# Patient Record
Sex: Male | Born: 1955 | Race: Black or African American | Hispanic: No | Marital: Married | Smoking: Never smoker
Health system: Southern US, Community
[De-identification: ages and names within clinical notes are randomized; demographics above are authoritative.]

## PROBLEM LIST (undated history)

## (undated) DIAGNOSIS — E119 Type 2 diabetes mellitus without complications: Secondary | ICD-10-CM

## (undated) DIAGNOSIS — H547 Unspecified visual loss: Secondary | ICD-10-CM

## (undated) DIAGNOSIS — R000 Tachycardia, unspecified: Secondary | ICD-10-CM

## (undated) DIAGNOSIS — E785 Hyperlipidemia, unspecified: Secondary | ICD-10-CM

## (undated) DIAGNOSIS — I10 Essential (primary) hypertension: Secondary | ICD-10-CM

## (undated) DIAGNOSIS — G4733 Obstructive sleep apnea (adult) (pediatric): Secondary | ICD-10-CM

## (undated) DIAGNOSIS — K219 Gastro-esophageal reflux disease without esophagitis: Secondary | ICD-10-CM

## (undated) HISTORY — DX: Type 2 diabetes mellitus without complications: E11.9

## (undated) HISTORY — DX: Tachycardia, unspecified: R00.0

## (undated) HISTORY — DX: Unspecified visual loss: H54.7

## (undated) HISTORY — PX: CHOLECYSTECTOMY: SHX55

## (undated) HISTORY — DX: Essential (primary) hypertension: I10

## (undated) HISTORY — DX: Hyperlipidemia, unspecified: E78.5

## (undated) HISTORY — DX: Obstructive sleep apnea (adult) (pediatric): G47.33

## (undated) HISTORY — DX: Gastro-esophageal reflux disease without esophagitis: K21.9

---

## 2010-04-18 ENCOUNTER — Encounter: Admission: RE | Admit: 2010-04-18 | Discharge: 2010-04-26 | Payer: Self-pay | Admitting: Internal Medicine

## 2010-04-29 ENCOUNTER — Ambulatory Visit (HOSPITAL_BASED_OUTPATIENT_CLINIC_OR_DEPARTMENT_OTHER): Admission: RE | Admit: 2010-04-29 | Discharge: 2010-04-29 | Payer: Self-pay | Admitting: Internal Medicine

## 2010-05-07 ENCOUNTER — Ambulatory Visit: Payer: Self-pay | Admitting: Internal Medicine

## 2010-06-28 ENCOUNTER — Encounter: Admission: RE | Admit: 2010-06-28 | Discharge: 2010-09-15 | Payer: Self-pay | Admitting: Internal Medicine

## 2010-08-25 HISTORY — PX: TRANSTHORACIC ECHOCARDIOGRAM: SHX275

## 2011-04-03 ENCOUNTER — Encounter: Payer: Self-pay | Admitting: Internal Medicine

## 2011-04-03 DIAGNOSIS — M1990 Unspecified osteoarthritis, unspecified site: Secondary | ICD-10-CM

## 2011-04-07 ENCOUNTER — Encounter: Payer: Self-pay | Admitting: Internal Medicine

## 2012-06-10 ENCOUNTER — Encounter: Payer: Self-pay | Admitting: Internal Medicine

## 2012-06-10 ENCOUNTER — Ambulatory Visit (INDEPENDENT_AMBULATORY_CARE_PROVIDER_SITE_OTHER)
Admission: RE | Admit: 2012-06-10 | Discharge: 2012-06-10 | Disposition: A | Discharge status: Home / Self Care | Payer: Medicaid Other | Source: Ambulatory Visit | Attending: Internal Medicine | Admitting: Internal Medicine

## 2012-06-10 ENCOUNTER — Ambulatory Visit (INDEPENDENT_AMBULATORY_CARE_PROVIDER_SITE_OTHER): Payer: Medicaid Other | Admitting: Internal Medicine

## 2012-06-10 VITALS — BP 132/84 | HR 87 | Temp 98.0°F | Ht 63.0 in | Wt 223.6 lb

## 2012-06-10 DIAGNOSIS — R05 Cough: Secondary | ICD-10-CM | POA: Insufficient documentation

## 2012-06-10 DIAGNOSIS — R059 Cough, unspecified: Secondary | ICD-10-CM

## 2012-06-10 NOTE — Assessment & Plan Note (Signed)
The most common causes of chronic cough in immunocompetent adults include the following: upper airway cough syndrome (UACS), previously referred to as postnasal drip syndrome (PNDS), which is caused by variety of rhinosinus conditions; (2) asthma; (3) GERD; (4) chronic bronchitis from cigarette smoking or other inhaled environmental irritants; (5) nonasthmatic eosinophilic bronchitis; and (6) bronchiectasis.   These conditions, singly or in combination, have accounted for up to 94% of the causes of chronic cough in prospective studies.   Other conditions have constituted no >6% of the causes in prospective studies These have included bronchogenic carcinoma, chronic interstitial pneumonia, sarcoidosis, left ventricular failure, ACEI-induced cough, and aspiration from a condition associated with pharyngeal dysfunction.   Chronic cough is often simultaneously caused by more than one condition. A single cause has been found from 38 to 82% of the time, multiple causes from 18 to 62%. Multiply caused cough has been the result of three diseases up to 42% of the time.    Reviewed with pt: The standardized cough guidelines recently published in Chest by [place] in 2006  are a multiple step process (up to 12!) , not a single office visit,  and are intended  to address this problem logically,  with an alogrithm dependent on response to empiric treatment at  each progressive step  to determine a specific diagnosis with  minimal addtional testing needed. Therefore if compliance is an issue or can't be accurately verified then it's very unlikely the standard evaluation and treatment will be successful here.    Furthermore, response to therapy (other than acute cough suppression, which should only be used short term with avoidance of narcotic containing cough syrups if possible), can be a gradual process for which the patient may not receive immediate benefit.  Unlike going to an eye doctor where the right rx is  almost always the first one and is immediately effective, this is almost never the case in the management of chronic cough syndromes and the patient needs to commit up front to compliance with recommendations and have the patience to wait out a response for up to 6 weeks of therapy directed at the likely underlying problem(s).    1st step is max rx for gerd with diet/ ppi qam and h2 in pm then regroup with all meds in hand in 4 weeks using a trust but verify approach at each step along the way

## 2012-06-10 NOTE — Patient Instructions (Addendum)
Pepcid ac 20mg  one every night after supper and continue nexium 40 mg take 30 min before first meal of the day  GERD (REFLUX)  is an extremely common cause of respiratory symptoms like a cough, many times with no significant heartburn at all.    It can be treated with medication, but also with lifestyle changes including avoidance of late meals, excessive alcohol, smoking cessation, and avoid fatty foods, chocolate, peppermint, colas, red wine, and acidic juices such as orange juice.  NO MINT OR MENTHOL PRODUCTS SO NO COUGH DROPS  USE SUGARLESS CANDY INSTEAD (jolley ranchers or Stover's - buy at [employer] or [employer])  NO OIL BASED VITAMINS - use powdered substitutes.  Please schedule a follow up office visit in 4 weeks, sooner if needed with all medications in hand

## 2012-06-10 NOTE — Progress Notes (Signed)
Subjective:    Patient ID: Andrew [name], male    DOB: 11-09-1956  MRN: [id number]  HPI  58 yobm born prematurely and blind but no respiratory issues until cough while on acei resolved off the acei while in [place] before 2009 then moved to [place] and cough recurred around 2010 and therefore referred to pulmonary clinic  06/10/2012 by Dr Concepcion Elk.  06/10/2012 1st pulmonary ov/ Jeury Mcnab cc persistent indolent onset dry cough, indolent onset x 2-3 years, present daily, worse at hs with frequent awakening assoc with sensation of sore throat.  Taking nexium for year before breakfast. Cough had the following features s seasonal variation or sign assoc sob. Intensity and freq of cough really has not changed in months.  Cough some better with cough suppression, no change with zyrtec.    Kouffman Reflux v Neurogenic Cough Differentiator Reflux Neurogenic Comments  Do you awaken from a sound sleep coughing violently?                            With trouble breathing? Yes    Do you have choking episodes when you cannot  Get enough air, gasping for air ?              no    Do you usually cough when you lie down into  The bed, or when you just lie down to rest ?                          Yes    Do you usually cough after meals or eating?         no    Do you cough when (or after) you bend over?    no    Do you more-or-less cough all day long?  Yes   Does change of temperature make you cough?  no   Does laughing or chuckling cause you to cough?  no   Do fumes (perfume, automobile fumes, burned  Toast, etc.,) cause you to cough ?       no   Does speaking, singing, or talking on the phone cause you to cough   ?                no   Total        Review of Systems  Constitutional: Negative for fever and unexpected weight change.  HENT: Positive for sore throat. Negative for ear pain, nosebleeds, congestion, rhinorrhea, sneezing, trouble swallowing, dental problem, postnasal drip and sinus pressure.     Eyes: Negative for redness and itching.  Respiratory: Positive for cough and chest tightness. Negative for shortness of breath and wheezing.   Cardiovascular: Negative for palpitations and leg swelling.  Gastrointestinal: Negative for nausea and vomiting.  Genitourinary: Negative for dysuria.  Musculoskeletal: Negative for joint swelling.  Skin: Negative for rash.  Neurological: Negative for headaches.  Hematological: Does not bruise/bleed easily.  Psychiatric/Behavioral: Negative for dysphoric mood. The patient is not nervous/anxious.        Objective:   Physical Exam amb blind obese bm nad occ throat clearing during interview  Wt 223 06/10/2012   HEENT: nl dentition, turbinates, and orophanx. Nl external ear canals without cough reflex   NECK :  without JVD/Nodes/TM/ nl carotid upstrokes bilaterally   LUNGS: no acc muscle use, clear to A and P bilaterally without cough on insp or exp maneuvers   CV:  RRR  no s3 or murmur or increase in P2, no edema   ABD:  Obese soft and nontender with nl excursion in the supine position. No bruits or organomegaly, bowel sounds nl  MS:  warm without deformities, calf tenderness, cyanosis or clubbing  SKIN: warm and dry without lesions    NEURO:  alert, approp, no deficits  CXR  06/10/2012 :  low lung vol/ otherwise nl         Assessment & Plan:

## 2012-06-11 NOTE — Progress Notes (Signed)
Quick Note:  Spoke with pt and notified of results per Dr. Wert. Pt verbalized understanding and denied any questions.  ______ 

## 2012-07-12 ENCOUNTER — Ambulatory Visit (INDEPENDENT_AMBULATORY_CARE_PROVIDER_SITE_OTHER): Payer: Medicaid Other | Admitting: Internal Medicine

## 2012-07-12 ENCOUNTER — Encounter: Payer: Self-pay | Admitting: Internal Medicine

## 2012-07-12 VITALS — BP 128/84 | HR 88 | Temp 97.8°F

## 2012-07-12 DIAGNOSIS — R05 Cough: Secondary | ICD-10-CM

## 2012-07-12 NOTE — Progress Notes (Signed)
Subjective:    Patient ID: Andrew [name], male    DOB: 05/07/1956  MRN: [id number]  HPI  84 yobm born prematurely and blind but no respiratory issues until cough while on acei resolved off the acei while in [place] before 2009 then moved to [place] and cough recurred around 2010 and therefore referred to pulmonary clinic  06/10/2012 by Dr Concepcion Elk.  06/10/2012 1st pulmonary ov/ Andrew [name] cc persistent indolent onset dry cough, indolent onset x 2-3 years, present daily, worse at hs with frequent awakening assoc with sensation of sore throat.  Taking nexium for year before breakfast. Cough had the following features s seasonal variation or sign assoc sob. Intensity and freq of cough really has not changed in months.  Cough some better with cough suppression, no change with zyrtec.  Kouffman Reflux v Neurogenic Cough Differentiator Reflux Neurogenic Comments  Do you awaken from a sound sleep coughing violently?                            With trouble breathing? Yes    Do you have choking episodes when you cannot  Get enough air, gasping for air ?              no    Do you usually cough when you lie down into  The bed, or when you just lie down to rest ?                          Yes    Do you usually cough after meals or eating?         no    Do you cough when (or after) you bend over?    no    Do you more-or-less cough all day long?  Yes   Does change of temperature make you cough?  no   Does laughing or chuckling cause you to cough?  no   Do fumes (perfume, automobile fumes, burned  Toast, etc.,) cause you to cough ?       no   Does speaking, singing, or talking on the phone cause you to cough   ?                no   Total      rec Pepcid ac 20mg  one every night after supper and continue nexium 40 mg take 30 min before first meal of the day GERD  Please schedule a follow up office visit in 4 weeks with all medications in hand    07/12/2012 f/u ov/Andrew [name] cc cough better to his satisfaction  95% >  No unusual cough, purulent sputum or sinus/hb symptoms on present rx.   Sleeping ok without nocturnal  or early am exacerbation  of respiratory  c/o's or need for noct saba. Also denies any obvious fluctuation of symptoms with weather or environmental changes or other aggravating or alleviating factors except as outlined above   ROS  The following are not active complaints unless bolded sore throat, dysphagia, dental problems, itching, sneezing,  nasal congestion or excess/ purulent secretions, ear ache,   fever, chills, sweats, unintended wt loss, pleuritic or exertional cp, hemoptysis,  orthopnea pnd or leg swelling, presyncope, palpitations, heartburn, abdominal pain, anorexia, nausea, vomiting, diarrhea  or change in bowel or urinary habits, change in stools or urine, dysuria,hematuria,  rash, arthralgias, visual complaints, headache, numbness weakness or ataxia or problems with walking or  coordination,  change in mood/affect or memory.            Objective:   Physical Exam amb blind obese very pot bellied bm nad rare  throat clearing during interview  Wt 223 06/10/2012 > 07/12/2012  Did not weigh in  HEENT: nl dentition, turbinates, and orophanx. Nl external ear canals without cough reflex   NECK :  without JVD/Nodes/TM/ nl carotid upstrokes bilaterally   LUNGS: no acc muscle use, clear to A and P bilaterally without cough on insp or exp maneuvers   CV:  RRR  no s3 or murmur or increase in P2, no edema   ABD:  Obese soft and nontender with nl excursion in the supine position. No bruits or organomegaly, bowel sounds nl  MS:  warm without deformities, calf tenderness, cyanosis or clubbing     CXR  06/10/2012 :  low lung vol/ otherwise nl         Assessment & Plan:

## 2012-07-12 NOTE — Patient Instructions (Addendum)
No change in medications for at least 3 months   Work on weight loss - you may need to see a nutritionist with food diary x 2 weeks in hand   If you are satisfied with your treatment plan let your doctor know and he/she can either refill your medications or you can return here when your prescription runs out.     If in any way you are not 100% satisfied,  please tell us.  If 100% better, tell your friends!

## 2012-07-13 NOTE — Assessment & Plan Note (Signed)
Discussed the role of his weight in terms of direct impact on dm and probably GERD as well  Strongly recommended consideration for nutrition eval with food diary if not making progress.

## 2012-07-13 NOTE — Assessment & Plan Note (Addendum)
    Classic Upper airway cough syndrome, so named because it's frequently impossible to sort out how much is  CR/sinusitis with freq throat clearing (which can be related to primary GERD)   vs  causing  secondary (" extra esophageal")  GERD from wide swings in gastric pressure that occur with throat clearing, often  promoting self use of mint and menthol lozenges that reduce the lower esophageal sphincter tone and exacerbate the problem further in a cyclical fashion.   These are the same pts (now being labeled as having "irritable larynx syndrome" by some cough centers) who not infrequently have a history of having failed to tolerate ace inhibitors,  dry powder inhalers or biphosphonates or report having atypical reflux symptoms that don't respond to standard doses of PPI , and are easily confused as having aecopd or asthma flares by even experienced allergists/ pulmonologists.   I had an extended summary  discussion with the patient today lasting 15 to 20 minutes of a 25 minute visit on the following issues:   For now continue max gerd rx and emphasized diet/ wt loss with no further pulmonary w/u indicated.  If exacerbates might consider 2 week empiric trial of reglan then even neurontin rather than further studies to sort our gerd related vs neuralgia pattern throat clearing

## 2013-02-27 ENCOUNTER — Encounter: Payer: Medicare Other | Attending: Internal Medicine | Admitting: *Deleted

## 2013-02-27 ENCOUNTER — Encounter: Payer: Self-pay | Admitting: *Deleted

## 2013-02-27 DIAGNOSIS — Z713 Dietary counseling and surveillance: Secondary | ICD-10-CM | POA: Insufficient documentation

## 2013-02-27 DIAGNOSIS — E119 Type 2 diabetes mellitus without complications: Secondary | ICD-10-CM | POA: Insufficient documentation

## 2013-02-27 NOTE — Patient Instructions (Signed)
Plan:  Aim for 3 Carb Choices per meal (45 grams) +/- 1 either way  Aim for 0-15 Carbs per snack if hungry  Consider checking BG at alternate times per day as directed by MD  Continue taking medication as directed by MD Information provided regarding another talking meter for you.

## 2013-02-27 NOTE — Progress Notes (Signed)
  Medical Nutrition Therapy:  Appt start time: 1030 end time:  1130.  Assessment:  Primary concerns today: patient here for diabetes education and obesity. 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 He lives with wife and has aides to assist with cooking etc. He states he SMBG using a talking meter most days, but not lately because has not had an aid to write down the numbers. He states he has low BG occasionally but not often.  MEDICATIONS: see list, he takes 45 units Lantus at 10 PM and Humalog @ 15 units pre meal both with pens   DIETARY INTAKE:  Usual eating pattern includes 3 meals and 1-2 snacks per day.  Everyday foods include good variety of all food groups.  Avoided foods include none stated.    24-hr recall:  B ( AM): grits OR applesauce and fried egg sandwich OR bowl of cereal OR a banana, occasionally coffee with Splenda Snk ( AM): occasionally trail mix  L ( PM): bring usually - sandwich, bag of chips and a piece of simple cake, Crystal Light or diet root beer, or water Snk ( PM): not usually D ( PM): wife or aid prepares a meat, starch, occasionally vegetables on the weekends, water or sugar free Koolaid Snk ( PM): popcorn or simple cake times 2-3 servings sometimes Beverages: coffee, water, crystal light  Usual physical activity: walks some at work and around his home when he gets fidgety  Estimated energy needs: 1400 calories 158/ g carbohydrates 105 g protein 39 g fat  Progress Towards Goal(s):  In progress.   Nutritional Diagnosis:  NB-1.1 Food and nutrition-related knowledge deficit As related to diabetes management.  As evidenced by A1c of 8.7% on 01-23-2013.    Intervention:  Nutrition counseling and diabetes education initiated verbally. Discussed basic physiology of diabetes, SMBG and rationale of checking BG at alternate times of day, A1c, Carb Counting and benefits of  increased activity. Emphasised Carb Counting via food groups which he was familiar with. Also discussed possible value of wearing a sensor for several days to better assess his BG patterns which he was very interested in doing.   Handouts given during visit include:patient is blind, no handouts today   Monitoring/Evaluation:  Dietary intake, exercise, SMBG more consistently as able, and body weight prn. Suggesting a referral from his MD for iPro CGM.

## 2013-05-23 ENCOUNTER — Ambulatory Visit: Payer: Medicare PPO | Admitting: Cardiovascular Disease

## 2013-06-04 ENCOUNTER — Ambulatory Visit: Payer: Medicare PPO | Admitting: Cardiovascular Disease

## 2013-08-14 ENCOUNTER — Encounter: Payer: Self-pay | Admitting: *Deleted

## 2013-08-19 ENCOUNTER — Encounter: Payer: Self-pay | Admitting: Cardiovascular Disease

## 2013-08-20 ENCOUNTER — Ambulatory Visit (INDEPENDENT_AMBULATORY_CARE_PROVIDER_SITE_OTHER): Payer: Medicare Other | Admitting: Cardiovascular Disease

## 2013-08-20 ENCOUNTER — Encounter: Payer: Self-pay | Admitting: Cardiovascular Disease

## 2013-08-20 VITALS — BP 142/86 | HR 92 | Ht 61.0 in | Wt 228.0 lb

## 2013-08-20 DIAGNOSIS — R000 Tachycardia, unspecified: Secondary | ICD-10-CM

## 2013-08-20 NOTE — Progress Notes (Signed)
08/20/2013 Andrew [name]   [birth year]  [id number]  Primary Physician Dorrene German, MD Primary Cardiologist: Runell Gess MD [name]   HPI:  The patient is a 57 year old moderately overweight, married, blind [place] American male, father of 2 children, who works at a [employer] for [employer]. I saw him for resting tachycardia. His risk factors include diabetes, hypertension, and hyperlipidemia. He is totally asymptomatic. I put him on a low-dose beta blocker, which somewhat improved his heart rate. A 2D echocardiogram was entirely normal, performed August 25, 2010. His most recent lab work, performed by Dr. Concepcion Elk, revealed a total cholesterol of 223, LDL of 129, and HDL of 50.  Since I saw him last 04/29/12 he's been completely asymptomatic and remains on low-dose beta-blockade     Current Outpatient Prescriptions  Medication Sig Dispense Refill  . BD PEN NEEDLE NANO U/F 32G X 4 MM MISC       . esomeprazole (NEXIUM) 40 MG capsule Take 40 mg by mouth daily before breakfast.      . famotidine (PEPCID) 20 MG tablet Take 20 mg by mouth at bedtime.      . fexofenadine (ALLEGRA) 180 MG tablet       . insulin glargine (LANTUS) 100 UNIT/ML injection Inject 45 Units into the skin at bedtime.       . insulin lispro (HUMALOG) 100 UNIT/ML injection Inject 15 Units into the skin 3 (three) times daily before meals.       . metoprolol succinate (TOPROL-XL) 25 MG 24 hr tablet Take 25 mg by mouth daily.       Marland Kitchen PROAIR HFA 108 (90 BASE) MCG/ACT inhaler       . zolpidem (AMBIEN) 10 MG tablet Take 10 mg by mouth at bedtime as needed.       No current facility-administered medications for this visit.    Allergies  Allergen Reactions  . Lisinopril     cough    History   Social History  . Marital Status: Married    Spouse Name: N/A    Number of Children: 2  . Years of Education: N/A   Occupational History  . disabled    Social History Main Topics  . Smoking  status: Never Smoker   . Smokeless tobacco: Never Used  . Alcohol Use: Yes     Comment: couples times a year  . Drug Use: No  . Sexual Activity: Not on file   Other Topics Concern  . Not on file   Social History Narrative  . No narrative on file     Review of Systems: General: negative for chills, fever, night sweats or weight changes.  Cardiovascular: negative for chest pain, dyspnea on exertion, edema, orthopnea, palpitations, paroxysmal nocturnal dyspnea or shortness of breath Dermatological: negative for rash Respiratory: negative for cough or wheezing Urologic: negative for hematuria Abdominal: negative for nausea, vomiting, diarrhea, bright red blood per rectum, melena, or hematemesis Neurologic: negative for visual changes, syncope, or dizziness All other systems reviewed and are otherwise negative except as noted above.    Blood pressure 142/86, pulse 92, height 5\' 1"  (1.549 m), weight 228 lb (103.42 kg).  General appearance: alert and no distress Neck: no adenopathy, no carotid bruit, no JVD, supple, symmetrical, trachea midline and thyroid not enlarged, symmetric, no tenderness/mass/nodules Lungs: clear to auscultation bilaterally Heart: regular rate and rhythm, S1, S2 normal, no murmur, click, rub or gallop Extremities: extremities normal, atraumatic, no cyanosis or edema  EKG  normal sinus rhythm at 92 with incomplete right bundle branch block left anterior fascicular block  ASSESSMENT AND PLAN:   Rapid heart rate Patient was sent to me for baseline resting tachycardia. 2-D echo was normal. He was placed on low-dose beta blocker which improved his heart rate. His car function tests were normal. He is currently asymptomatic we'll see him back as needed.      Runell Gess MD FACP,FACC,FAHA, FSCAI 08/20/2013 4:00 PM

## 2013-08-20 NOTE — Assessment & Plan Note (Signed)
Patient was sent to me for baseline resting tachycardia. 2-D echo was normal. He was placed on low-dose beta blocker which improved his heart rate. His car function tests were normal. He is currently asymptomatic we'll see him back as needed.

## 2013-08-20 NOTE — Patient Instructions (Addendum)
Follow up with Dr Berry as needed.  

## 2013-09-03 ENCOUNTER — Encounter: Payer: Self-pay | Admitting: Endocrinology

## 2013-09-03 ENCOUNTER — Ambulatory Visit (INDEPENDENT_AMBULATORY_CARE_PROVIDER_SITE_OTHER): Payer: Medicare Other | Admitting: Endocrinology

## 2013-09-03 VITALS — BP 128/72 | HR 112 | Ht 62.0 in | Wt 229.0 lb

## 2013-09-03 DIAGNOSIS — J309 Allergic rhinitis, unspecified: Secondary | ICD-10-CM

## 2013-09-03 DIAGNOSIS — H35179 Retrolental fibroplasia, unspecified eye: Secondary | ICD-10-CM

## 2013-09-03 DIAGNOSIS — E109 Type 1 diabetes mellitus without complications: Secondary | ICD-10-CM

## 2013-09-03 DIAGNOSIS — H35173 Retrolental fibroplasia, bilateral: Secondary | ICD-10-CM

## 2013-09-03 LAB — MICROALBUMIN / CREATININE URINE RATIO
Creatinine,U: 115.2 mg/dL
Microalb Creat Ratio: 0.6 mg/g (ref 0.0–30.0)
Microalb, Ur: 0.7 mg/dL (ref 0.0–1.9)

## 2013-09-03 LAB — HEMOGLOBIN A1C: Hgb A1c MFr Bld: 9.5 % — ABNORMAL HIGH (ref 4.6–6.5)

## 2013-09-03 NOTE — Progress Notes (Signed)
Subjective:    Patient ID: Andrew [name], male    DOB: Aug 19, 1956, 57 y.o.   MRN: [id number]  HPI pt states DM was dx'ed in 1991; he has mild if any neuropathy of the lower extremities; he is unaware of any associated chronic complications.  he has been on insulin since approx 2008.  pt says his diet and exercise are not very good.   He has a talking cbg meter.  He says cbg's vary from 80-200.  It is in general higher as the day goes on.  Past Medical History  Diagnosis Date  . Blindness   . HTN (hypertension)   . Diabetes mellitus   . Hyperlipidemia   . Tachycardia     resting; on low dose B-Blocker  . OSA (obstructive sleep apnea)     AHI 56.3/hr, loud snoring, desaturation to 73% on RA (Dr. Jetty Duhamel)  . GERD (gastroesophageal reflux disease)     Past Surgical History  Procedure Laterality Date  . Cholecystectomy    . Transthoracic echocardiogram  08/25/2010    normal; EF=>55%; mild-mod conc LVH; mild MR    History   Social History  . Marital Status: Married    Spouse Name: N/A    Number of Children: 2  . Years of Education: N/A   Occupational History  . disabled    Social History Main Topics  . Smoking status: Never Smoker   . Smokeless tobacco: Never Used  . Alcohol Use: Yes     Comment: couples times a year  . Drug Use: No  . Sexual Activity: Not on file   Other Topics Concern  . Not on file   Social History Narrative  . No narrative on file   Current Outpatient Prescriptions on File Prior to Visit  Medication Sig Dispense Refill  . BD PEN NEEDLE NANO U/F 32G X 4 MM MISC       . esomeprazole (NEXIUM) 40 MG capsule Take 40 mg by mouth daily before breakfast.      . famotidine (PEPCID) 20 MG tablet Take 20 mg by mouth at bedtime.      . fexofenadine (ALLEGRA) 180 MG tablet       . insulin glargine (LANTUS) 100 UNIT/ML injection Inject 45 Units into the skin at bedtime.       . insulin lispro (HUMALOG) 100 UNIT/ML injection Inject 15 Units into the  skin 3 (three) times daily before meals.       . metoprolol succinate (TOPROL-XL) 25 MG 24 hr tablet Take 25 mg by mouth daily.       Marland Kitchen PROAIR HFA 108 (90 BASE) MCG/ACT inhaler       . zolpidem (AMBIEN) 10 MG tablet Take 10 mg by mouth at bedtime as needed.       No current facility-administered medications on file prior to visit.    Allergies  Allergen Reactions  . Lisinopril     cough    Family History  Problem Relation Age of Onset  . Cancer Paternal Grandmother   . Diabetes Father   . Diabetes Brother   . Diabetes Maternal Grandmother     BP 128/72  Pulse 112  Ht 5\' 2"  (1.575 m)  Wt 229 lb (103.874 kg)  BMI 41.87 kg/m2  SpO2 97%  Review of Systems denies weight loss, hearing loss, headache, chest pain, sob, n/v, urinary frequency, cramps, excessive diaphoresis, memory loss, depression, hypoglycemia, rhinorrhea, and easy bruising.    Objective:   Physical  Exam VS: see vs page GEN: no distress HEAD: head: no deformity eyes: no periorbital swelling, no proptosis external nose and ears are normal mouth: no lesion seen NECK: supple, thyroid is not enlarged CHEST WALL: no deformity LUNGS: clear to auscultation BREASTS:  No gynecomastia CV: reg rate and rhythm, no murmur ABD: abdomen is soft, nontender.  no hepatosplenomegaly.  not distended.  no hernia MUSCULOSKELETAL: muscle bulk and strength are grossly normal.  no obvious joint swelling.  gait is normal and steady PULSES: no carotid bruit NEURO:  cn 2-12 grossly intact.   readily moves all 4's.   SKIN:  Normal texture and temperature.  No rash or suspicious lesion is visible.   NODES:  None palpable at the neck PSYCH: alert, oriented x3.  Does not appear anxious nor depressed.  Lab Results  Component Value Date   HGBA1C 9.5* 09/03/2013       Assessment & Plan:  DM: this a1c causes very high risk to his health.  This insulin regimen was chosen from multiple options, as it best matches his insulin to his  changing requirements throughout the day.  The benefits of glycemic control must be weighed against the risks of hypoglycemia.  He needs increased rx. Morbid obesity: this complicates the rx of DM. OA: this limits the exercise rx of DM Blindness: AVS is read to patient.

## 2013-09-03 NOTE — Patient Instructions (Addendum)
good diet and exercise habits significanly improve the control of your diabetes.  please let me know if you wish to be referred to a dietician.  high blood sugar is very risky to your health.  You are at higher than average risk for pneumonia and hepatitis-B.  You should be vaccinated against both.   controlling your blood pressure and cholesterol drastically reduces the damage diabetes does to your body.  this also applies to quitting smoking.  please discuss these with your doctor.  check your blood sugar twice a day.  vary the time of day when you check, between before the 3 meals, and at bedtime.  also check if you have symptoms of your blood sugar being too high or too low.  please keep a record of the readings and bring it to your next appointment here.  please call us sooner if your blood sugar goes below 70, or if you have a lot of readings over 200.  blood tests are being requested for you today.  We'll contact you with results.   Please come back for a follow-up appointment in 1 month.

## 2013-09-04 DIAGNOSIS — J309 Allergic rhinitis, unspecified: Secondary | ICD-10-CM | POA: Insufficient documentation

## 2013-10-03 ENCOUNTER — Ambulatory Visit (INDEPENDENT_AMBULATORY_CARE_PROVIDER_SITE_OTHER): Payer: Medicare Other | Admitting: Endocrinology

## 2013-10-03 VITALS — BP 134/80 | HR 95 | Wt 228.0 lb

## 2013-10-03 DIAGNOSIS — E109 Type 1 diabetes mellitus without complications: Secondary | ICD-10-CM

## 2013-10-03 NOTE — Patient Instructions (Addendum)
check your blood sugar twice a day.  vary the time of day when you check, between before the 3 meals, and at bedtime.  also check if you have symptoms of your blood sugar being too high or too low.  please keep a record of the readings and bring it to your next appointment here.  please call us sooner if your blood sugar goes below 70, or if you have a lot of readings over 200.  Please continue the same insulins for now.     Please come back for a follow-up appointment in 1 month, on a Wednesday.

## 2013-10-03 NOTE — Progress Notes (Signed)
Subjective:    Patient ID: Andrew [name], male    DOB: 1956-07-17, 57 y.o.   MRN: [id number]  HPI pt returns for f/u of insulin-requiring DM (dx'ed 1991; he has mild if any neuropathy of the lower extremities; he is unaware of any associated chronic complications; he has been on insulin since approx 2008; he has a talking cbg meter).  Since last ov, he says cbg's have varied from 90-240, but most are in the low-100's.  There is no trend throughout the day.  He feels the variation may be due to problems with injection technique.   Past Medical History  Diagnosis Date  . Blindness   . HTN (hypertension)   . Diabetes mellitus   . Hyperlipidemia   . Tachycardia     resting; on low dose B-Blocker  . OSA (obstructive sleep apnea)     AHI 56.3/hr, loud snoring, desaturation to 73% on RA (Dr. Jetty Duhamel)  . GERD (gastroesophageal reflux disease)     Past Surgical History  Procedure Laterality Date  . Cholecystectomy    . Transthoracic echocardiogram  08/25/2010    normal; EF=>55%; mild-mod conc LVH; mild MR    History   Social History  . Marital Status: Married    Spouse Name: N/A    Number of Children: 2  . Years of Education: N/A   Occupational History  . disabled    Social History Main Topics  . Smoking status: Never Smoker   . Smokeless tobacco: Never Used  . Alcohol Use: Yes     Comment: couples times a year  . Drug Use: No  . Sexual Activity: Not on file   Other Topics Concern  . Not on file   Social History Narrative  . No narrative on file    Current Outpatient Prescriptions on File Prior to Visit  Medication Sig Dispense Refill  . BD PEN NEEDLE NANO U/F 32G X 4 MM MISC       . esomeprazole (NEXIUM) 40 MG capsule Take 40 mg by mouth daily before breakfast.      . famotidine (PEPCID) 20 MG tablet Take 20 mg by mouth at bedtime.      . fexofenadine (ALLEGRA) 180 MG tablet       . insulin glargine (LANTUS) 100 UNIT/ML injection Inject 30 Units into the skin  at bedtime.       . insulin lispro (HUMALOG) 100 UNIT/ML injection Inject 20 Units into the skin 3 (three) times daily before meals.       . metoprolol succinate (TOPROL-XL) 25 MG 24 hr tablet Take 25 mg by mouth daily.       Marland Kitchen PROAIR HFA 108 (90 BASE) MCG/ACT inhaler       . zolpidem (AMBIEN) 10 MG tablet Take 10 mg by mouth at bedtime as needed.       No current facility-administered medications on file prior to visit.    Allergies  Allergen Reactions  . Lisinopril     cough    Family History  Problem Relation Age of Onset  . Cancer Paternal Grandmother   . Diabetes Father   . Diabetes Brother   . Diabetes Maternal Grandmother    BP 134/80  Pulse 95  Wt 228 lb (103.42 kg)  BMI 41.69 kg/m2  SpO2 98%  Review of Systems denies hypoglycemia and weight change    Objective:   Physical Exam VITAL SIGNS:  See vs page GENERAL: no distress SKIN:  Insulin injection sites  at the anterior abdomen are normal      Assessment & Plan:  DM: this a1c causes very high risk to his health.  This insulin regimen was chosen from multiple options, as it best matches his insulin to his changing requirements throughout the day.  The benefits of glycemic control must be weighed against the risks of hypoglycemia.  Control is improved. Morbid obesity: this complicates the rx of DM. OA: this limits the exercise rx of DM Blindness: this may be limiting insulin injection teqhnique.  He'll see our DM educator the same day.

## 2013-10-29 ENCOUNTER — Encounter: Payer: Self-pay | Admitting: Podiatry

## 2013-10-29 ENCOUNTER — Ambulatory Visit (INDEPENDENT_AMBULATORY_CARE_PROVIDER_SITE_OTHER): Payer: Medicare Other | Admitting: Podiatry

## 2013-10-29 VITALS — BP 127/78 | HR 100 | Resp 12 | Ht 61.0 in | Wt 220.0 lb

## 2013-10-29 DIAGNOSIS — B351 Tinea unguium: Secondary | ICD-10-CM

## 2013-10-29 DIAGNOSIS — M79609 Pain in unspecified limb: Secondary | ICD-10-CM

## 2013-10-29 NOTE — Progress Notes (Signed)
Patient ID: Andrew [name], male   DOB: 07/16/1956, 57 y.o.   MRN: [id number]  Subjective: This blind 57-year-old black male presents for ongoing debridement of painful mycotic toenails at three-month intervals.   Objective: Brown, black, hypertrophic toenails with texture and color changes x10 with palpable tenderness in all 10 nail plates.   Assessment: Symptomatic onychomycoses x10  Diabetes   Plan: All 10 toenails are debrided back without any bleeding. Reappoint at three-month intervals.  

## 2014-02-04 ENCOUNTER — Ambulatory Visit: Payer: Medicare Other | Admitting: Podiatry

## 2014-03-02 ENCOUNTER — Ambulatory Visit (INDEPENDENT_AMBULATORY_CARE_PROVIDER_SITE_OTHER): Payer: Medicare Other | Admitting: Podiatry

## 2014-03-02 ENCOUNTER — Encounter: Payer: Self-pay | Admitting: Podiatry

## 2014-03-02 VITALS — BP 131/76 | HR 101 | Resp 18

## 2014-03-02 DIAGNOSIS — M79609 Pain in unspecified limb: Secondary | ICD-10-CM

## 2014-03-02 DIAGNOSIS — B351 Tinea unguium: Secondary | ICD-10-CM

## 2014-03-03 NOTE — Progress Notes (Signed)
Patient ID: Andrew NeedsJohnny R [name], male   DOB: [birth year], 57 y.o.   MRN: [id number]  Subjective: This blind 58 year old black male presents for ongoing debridement of painful mycotic toenails at three-month intervals.   Objective: Brown, black, hypertrophic toenails with texture and color changes x10 with palpable tenderness in all 10 nail plates.   Assessment: Symptomatic onychomycoses x10  Diabetes   Plan: All 10 toenails are debrided back without any bleeding. Reappoint at three-month intervals.

## 2014-04-27 ENCOUNTER — Ambulatory Visit: Payer: Medicare Other | Admitting: Endocrinology

## 2014-05-01 ENCOUNTER — Encounter: Payer: Self-pay | Admitting: Endocrinology

## 2014-05-01 ENCOUNTER — Ambulatory Visit (INDEPENDENT_AMBULATORY_CARE_PROVIDER_SITE_OTHER): Payer: Medicare Other | Admitting: Endocrinology

## 2014-05-01 VITALS — BP 118/78 | HR 82 | Temp 98.2°F | Ht 61.0 in | Wt 228.0 lb

## 2014-05-01 DIAGNOSIS — E109 Type 1 diabetes mellitus without complications: Secondary | ICD-10-CM

## 2014-05-01 MED ORDER — INSULIN DETEMIR 100 UNIT/ML FLEXPEN: 120.0000 [IU] | PEN_INJECTOR | SUBCUTANEOUS | Status: DC

## 2014-05-01 NOTE — Patient Instructions (Addendum)
check your blood sugar twice a day.  vary the time of day when you check, between before the 3 meals, and at bedtime.  also check if you have symptoms of your blood sugar being too high or too low.  please keep a record of the readings and bring it to your next appointment here.  please call us sooner if your blood sugar goes below 70, or if you have a lot of readings over 200.  Please continue the same insulins to "levemir flextouch," 120 units each morning.   Please come back for a follow-up appointment in 2 weeks, on a Tuesday or Wednesday.

## 2014-05-01 NOTE — Progress Notes (Signed)
Subjective:    Patient ID: Andrew [name], male    DOB: [birth year], 58 y.o.   MRN: [id number]  HPI pt returns for f/u of insulin-requiring DM (dx'ed 1991; he has mild if any neuropathy of the lower extremities; he is unaware of any associated chronic complications; he has been on insulin since approx 2008; he has a talking cbg meter).  no cbg record, but states cbg's vary from 135-300.  He seldom checks, though.  He says he is still having trouble with using the insulin pen.   Past Medical History  Diagnosis Date  . Blindness   . HTN (hypertension)   . Diabetes mellitus   . Hyperlipidemia   . Tachycardia     resting; on low dose B-Blocker  . OSA (obstructive sleep apnea)     AHI 56.3/hr, loud snoring, desaturation to 73% on RA (Dr. Jetty Duhamellinton Young)  . GERD (gastroesophageal reflux disease)     Past Surgical History  Procedure Laterality Date  . Cholecystectomy    . Transthoracic echocardiogram  08/25/2010    normal; EF=>55%; mild-mod conc LVH; mild MR    History   Social History  . Marital Status: Married    Spouse Name: N/A    Number of Children: 2  . Years of Education: N/A   Occupational History  . disabled    Social History Main Topics  . Smoking status: Never Smoker   . Smokeless tobacco: Never Used  . Alcohol Use: Yes     Comment: couples times a year  . Drug Use: No  . Sexual Activity: Not on file   Other Topics Concern  . Not on file   Social History Narrative  . No narrative on file    Current Outpatient Prescriptions on File Prior to Visit  Medication Sig Dispense Refill  . BD PEN NEEDLE NANO U/F 32G X 4 MM MISC       . esomeprazole (NEXIUM) 40 MG capsule Take 40 mg by mouth 2 (two) times daily before a meal.       . famotidine (PEPCID) 20 MG tablet Take 20 mg by mouth at bedtime.      . fexofenadine (ALLEGRA) 180 MG tablet       . insulin glargine (LANTUS) 100 UNIT/ML injection Inject 30 Units into the skin at bedtime.       . insulin lispro  (HUMALOG) 100 UNIT/ML injection Inject 20 Units into the skin 3 (three) times daily before meals.       Marland Kitchen. losartan (COZAAR) 100 MG tablet       . metoprolol succinate (TOPROL-XL) 25 MG 24 hr tablet Take 25 mg by mouth daily.       . pantoprazole (PROTONIX) 40 MG tablet       . PROAIR HFA 108 (90 BASE) MCG/ACT inhaler       . zolpidem (AMBIEN) 10 MG tablet Take 10 mg by mouth at bedtime as needed.       No current facility-administered medications on file prior to visit.    Allergies  Allergen Reactions  . Lisinopril     cough    Family History  Problem Relation Age of Onset  . Cancer Paternal Grandmother   . Diabetes Father   . Diabetes Brother   . Diabetes Maternal Grandmother     BP 118/78  Pulse 82  Temp(Src) 98.2 F (36.8 C) (Oral)  Ht 5\' 1"  (1.549 m)  Wt 228 lb (103.42 kg)  BMI 43.10 kg/m2  SpO2 98%  Review of Systems He denies hypoglycemia and weight change.     Objective:   Physical Exam VITAL SIGNS:  See vs page GENERAL: no distress    outside test results are reviewed: A1c=10.5    Assessment & Plan:  DM: he needs increased rx Noncompliance with f/u appts and cbg's.  This complicates the rx of DM. Morbid obesity: this complicates the rx of DM. OA: this limits the exercise rx of DM Blindness: this may be limiting insulin injection teqhnique.  He'll see our DM educator the same day as return ov here.

## 2014-05-04 ENCOUNTER — Telehealth: Payer: Self-pay | Admitting: Endocrinology

## 2014-05-04 NOTE — Telephone Encounter (Signed)
Patient states he was put on the flex pen on Friday 05/01/14  You need to know how many times a day to take and should he discontinue the other medication   Thank You :)

## 2014-05-04 NOTE — Telephone Encounter (Signed)
Pt advised his current insulin regimun is to take 120 units of levemir into the skin each morning.

## 2014-05-13 ENCOUNTER — Encounter: Payer: Self-pay | Admitting: Endocrinology

## 2014-05-13 ENCOUNTER — Ambulatory Visit (INDEPENDENT_AMBULATORY_CARE_PROVIDER_SITE_OTHER): Payer: Medicare Other | Admitting: Endocrinology

## 2014-05-13 ENCOUNTER — Encounter: Payer: Medicare Other | Attending: Endocrinology | Admitting: Nutrition

## 2014-05-13 VITALS — BP 122/64 | HR 99 | Temp 97.7°F | Ht 61.0 in | Wt 222.0 lb

## 2014-05-13 DIAGNOSIS — Z713 Dietary counseling and surveillance: Secondary | ICD-10-CM | POA: Insufficient documentation

## 2014-05-13 DIAGNOSIS — E109 Type 1 diabetes mellitus without complications: Secondary | ICD-10-CM

## 2014-05-13 DIAGNOSIS — H543 Unqualified visual loss, both eyes: Secondary | ICD-10-CM | POA: Insufficient documentation

## 2014-05-13 DIAGNOSIS — E119 Type 2 diabetes mellitus without complications: Secondary | ICD-10-CM | POA: Insufficient documentation

## 2014-05-13 LAB — GLUCOSE, POCT (MANUAL RESULT ENTRY): POC Glucose: 388 mg/dl — AB (ref 70–99)

## 2014-05-13 NOTE — Progress Notes (Signed)
Subjective:    Patient ID: Andrew [name], male    DOB: June 15, 1956, 58 y.o.   MRN: [id number]  HPI pt returns for f/u of insulin-requiring DM (dx'ed 1991, when he presented with severe hyperglycemia (glucose was 1300); he has mild if any neuropathy of the lower extremities; he is unaware of any associated chronic complications; he has been on insulin since approx 2008; he has a talking cbg meter; he has never had pancreatitis, severe hypoglycemia, or DKA).  no cbg record, but states cbg's vary from 200-300.  He seldom checks, though.  He says he is still having trouble with using the insulin pen.   Past Medical History  Diagnosis Date  . Blindness   . HTN (hypertension)   . Diabetes mellitus   . Hyperlipidemia   . Tachycardia     resting; on low dose B-Blocker  . OSA (obstructive sleep apnea)     AHI 56.3/hr, loud snoring, desaturation to 73% on RA (Dr. Jetty Duhamel)  . GERD (gastroesophageal reflux disease)     Past Surgical History  Procedure Laterality Date  . Cholecystectomy    . Transthoracic echocardiogram  08/25/2010    normal; EF=>55%; mild-mod conc LVH; mild MR    History   Social History  . Marital Status: Married    Spouse Name: N/A    Number of Children: 2  . Years of Education: N/A   Occupational History  . disabled    Social History Main Topics  . Smoking status: Never Smoker   . Smokeless tobacco: Never Used  . Alcohol Use: Yes     Comment: couples times a year  . Drug Use: No  . Sexual Activity: Not on file   Other Topics Concern  . Not on file   Social History Narrative  . No narrative on file    Current Outpatient Prescriptions on File Prior to Visit  Medication Sig Dispense Refill  . BD PEN NEEDLE NANO U/F 32G X 4 MM MISC       . esomeprazole (NEXIUM) 40 MG capsule Take 40 mg by mouth 2 (two) times daily before a meal.       . famotidine (PEPCID) 20 MG tablet Take 20 mg by mouth at bedtime.      . fexofenadine (ALLEGRA) 180 MG tablet         . losartan (COZAAR) 100 MG tablet       . metoprolol succinate (TOPROL-XL) 25 MG 24 hr tablet Take 25 mg by mouth daily.       . pantoprazole (PROTONIX) 40 MG tablet       . PROAIR HFA 108 (90 BASE) MCG/ACT inhaler       . zolpidem (AMBIEN) 10 MG tablet Take 10 mg by mouth at bedtime as needed.       No current facility-administered medications on file prior to visit.    Allergies  Allergen Reactions  . Lisinopril     cough    Family History  Problem Relation Age of Onset  . Cancer Paternal Grandmother   . Diabetes Father   . Diabetes Brother   . Diabetes Maternal Grandmother     BP 122/64  Pulse 99  Temp(Src) 97.7 F (36.5 C) (Oral)  Ht 5\' 1"  (1.549 m)  Wt 222 lb (100.699 kg)  BMI 41.97 kg/m2  SpO2 97%  Review of Systems He denies hypoglycemia.  He has lost a few lbs.      Objective:   Physical Exam  VITAL SIGNS:  See vs page GENERAL: no distress SKIN:  Insulin injection sites at the anterior abdomen are normal.     Lab Results  Component Value Date   HGBA1C 9.5* 09/03/2013      Assessment & Plan:  DM: moderate exacerbation Blindness: he needs insulin pen.  I discussed with L. Spagnola, CDE.

## 2014-05-13 NOTE — Progress Notes (Signed)
Pt. Reports that he is "counting the clicks on his Levemir pen to 60 and doing this twice every morning.  His dose is increasing to 150u q AM.  He was shown how to dial up to the 80u mark (the pen will stop at 80u), and he will turn the "clicker backward" 5 clicks (5 units) to get to 75u.  He did this for me, and he did this correctly, with 75u showing on the pen.  He reported that he would prefer doing it this way, rather than "counting up to 75".   He is injecting into an area on his left abdomen approximately 4 inches around only one site.  He does not want to use the right side because of scars that are there.  We talked about using areas on his left side above and below his 4 inch circle that he is using now.  He was not very willing to try these new sites, but it was encouraged strongly.    His diet is balanced, but high in fat.  His blindness and financial situation limits his ablity to make healthier choices for meats, fruits and veg.

## 2014-05-13 NOTE — Patient Instructions (Signed)
check your blood sugar twice a day.  vary the time of day when you check, between before the 3 meals, and at bedtime.  also check if you have symptoms of your blood sugar being too high or too low.  please keep a record of the readings and bring it to your next appointment here.  please call us sooner if your blood sugar goes below 70, or if you have a lot of readings over 200.  Please increase the levemir, to 150 units each morning.   Please come back for a follow-up appointment in 2 weeks.

## 2014-05-28 ENCOUNTER — Encounter: Payer: Self-pay | Admitting: Endocrinology

## 2014-05-28 ENCOUNTER — Ambulatory Visit (INDEPENDENT_AMBULATORY_CARE_PROVIDER_SITE_OTHER): Payer: Medicare Other | Admitting: Endocrinology

## 2014-05-28 VITALS — BP 122/78 | HR 103 | Temp 98.4°F | Ht 61.0 in | Wt 221.0 lb

## 2014-05-28 DIAGNOSIS — E109 Type 1 diabetes mellitus without complications: Secondary | ICD-10-CM

## 2014-05-28 MED ORDER — INSULIN DETEMIR 100 UNIT/ML FLEXPEN: 200.0000 [IU] | PEN_INJECTOR | SUBCUTANEOUS | Status: DC

## 2014-05-28 NOTE — Patient Instructions (Signed)
check your blood sugar twice a day.  vary the time of day when you check, between before the 3 meals, and at bedtime.  also check if you have symptoms of your blood sugar being too high or too low.  please keep a record of the readings and bring it to your next appointment here.  please call us sooner if your blood sugar goes below 70, or if you have a lot of readings over 200.  Please increase the levemir, to 200 units each morning.   Please come back for a follow-up appointment in 3 weeks, and please see Andrew [name] the same day.

## 2014-05-28 NOTE — Progress Notes (Signed)
Subjective:    Patient ID: Andrew [name], male    DOB: 12-Jul-1956, 58 y.o.   MRN: [id number]  HPI pt returns for f/u of insulin-requiring DM (dx'ed 1991, when he presented with severe hyperglycemia (glucose was 1300); he has mild if any neuropathy of the lower extremities; he is unaware of any associated chronic complications; he has been on insulin since approx 2008; he has a talking cbg meter; he has never had pancreatitis, severe hypoglycemia, or DKA).  no cbg record, but states cbg's are in the 200's.  He seldom checks, though.  He says he is now good at using the insulin pen.  However, he wants to consider the tuojeo pen. Past Medical History  Diagnosis Date  . Blindness   . HTN (hypertension)   . Diabetes mellitus   . Hyperlipidemia   . Tachycardia     resting; on low dose B-Blocker  . OSA (obstructive sleep apnea)     AHI 56.3/hr, loud snoring, desaturation to 73% on RA (Dr. Jetty Duhamel)  . GERD (gastroesophageal reflux disease)     Past Surgical History  Procedure Laterality Date  . Cholecystectomy    . Transthoracic echocardiogram  08/25/2010    normal; EF=>55%; mild-mod conc LVH; mild MR    History   Social History  . Marital Status: Married    Spouse Name: N/A    Number of Children: 2  . Years of Education: N/A   Occupational History  . disabled    Social History Main Topics  . Smoking status: Never Smoker   . Smokeless tobacco: Never Used  . Alcohol Use: Yes     Comment: couples times a year  . Drug Use: No  . Sexual Activity: Not on file   Other Topics Concern  . Not on file   Social History Narrative  . No narrative on file    Current Outpatient Prescriptions on File Prior to Visit  Medication Sig Dispense Refill  . BD PEN NEEDLE NANO U/F 32G X 4 MM MISC       . esomeprazole (NEXIUM) 40 MG capsule Take 40 mg by mouth 2 (two) times daily before a meal.       . famotidine (PEPCID) 20 MG tablet Take 20 mg by mouth at bedtime.      . fexofenadine  (ALLEGRA) 180 MG tablet       . losartan (COZAAR) 100 MG tablet       . metoprolol succinate (TOPROL-XL) 25 MG 24 hr tablet Take 25 mg by mouth daily.       . pantoprazole (PROTONIX) 40 MG tablet       . PROAIR HFA 108 (90 BASE) MCG/ACT inhaler       . zolpidem (AMBIEN) 10 MG tablet Take 10 mg by mouth at bedtime as needed.       No current facility-administered medications on file prior to visit.    Allergies  Allergen Reactions  . Lisinopril     cough    Family History  Problem Relation Age of Onset  . Cancer Paternal Grandmother   . Diabetes Father   . Diabetes Brother   . Diabetes Maternal Grandmother     BP 122/78  Pulse 103  Temp(Src) 98.4 F (36.9 C) (Oral)  Ht 5\' 1"  (1.549 m)  Wt 221 lb (100.245 kg)  BMI 41.78 kg/m2  SpO2 93%  Review of Systems He denies hypoglycemia and weight change.    Objective:   Physical Exam  VITAL SIGNS:  See vs page. GENERAL: no distress. PSYCH: Alert and well-oriented.  Does not appear anxious nor depressed.  Lab Results  Component Value Date   HGBA1C 9.5* 09/03/2013      Assessment & Plan:  DM: moderate exacerbation. Noncompliance with cbg recording: I'll work around this as best I can.      Patient is advised the following: Patient Instructions  check your blood sugar twice a day.  vary the time of day when you check, between before the 3 meals, and at bedtime.  also check if you have symptoms of your blood sugar being too high or too low.  please keep a record of the readings and bring it to your next appointment here.  please call us sooner if your blood sugar goes below 70, or if you have a lot of readings over 200.  Please increase the levemir, to 200 units each morning.   Please come back for a follow-up appointment in 3 weeks, and please see Bonita QuinLinda the same day.

## 2014-06-08 ENCOUNTER — Ambulatory Visit (INDEPENDENT_AMBULATORY_CARE_PROVIDER_SITE_OTHER): Payer: Medicare Other | Admitting: Podiatry

## 2014-06-08 ENCOUNTER — Encounter: Payer: Self-pay | Admitting: Podiatry

## 2014-06-08 VITALS — BP 147/83 | HR 95 | Resp 15 | Ht 61.0 in | Wt 200.0 lb

## 2014-06-08 DIAGNOSIS — B351 Tinea unguium: Secondary | ICD-10-CM

## 2014-06-08 DIAGNOSIS — M79609 Pain in unspecified limb: Secondary | ICD-10-CM

## 2014-06-08 DIAGNOSIS — M79673 Pain in unspecified foot: Secondary | ICD-10-CM

## 2014-06-09 NOTE — Progress Notes (Signed)
Patient ID: Andrew [name], male   DOB: Apr 25, 1956, 58 y.o.   MRN: [id number]  Subjective: Orientated x3 blind, diabetic black male presents complaining of painful toenails.  Objective: Elongated, hypertrophic, discolored toenails x10  Assessment: Symptomatic onychomycoses x10  Plan: Nails x10 are debrided without a bleeding  Reappoint at three-month intervals

## 2014-06-23 ENCOUNTER — Encounter: Payer: Medicare Other | Attending: Endocrinology | Admitting: Nutrition

## 2014-06-23 ENCOUNTER — Ambulatory Visit (INDEPENDENT_AMBULATORY_CARE_PROVIDER_SITE_OTHER): Payer: Medicare Other | Admitting: Endocrinology

## 2014-06-23 ENCOUNTER — Encounter: Payer: Self-pay | Admitting: Endocrinology

## 2014-06-23 VITALS — BP 136/96 | HR 93 | Temp 98.2°F | Ht 61.0 in | Wt 222.0 lb

## 2014-06-23 DIAGNOSIS — E109 Type 1 diabetes mellitus without complications: Secondary | ICD-10-CM | POA: Diagnosis not present

## 2014-06-23 DIAGNOSIS — Z794 Long term (current) use of insulin: Secondary | ICD-10-CM | POA: Diagnosis not present

## 2014-06-23 NOTE — Progress Notes (Signed)
Subjective:    Patient ID: Andrew [name], male    DOB: January 02, 1956, 58 y.o.   MRN: [id number]  HPI pt returns for f/u of insulin-requiring DM (dx'ed 1991, when he presented with severe hyperglycemia (glucose was 1300); he has mild if any neuropathy of the lower extremities; he is unaware of any associated chronic complications; he has been on insulin since approx 2008; he has a talking cbg meter; he has never had pancreatitis, severe hypoglycemia, or DKA; he was changed to a simple qd insulin regimen, due to poor results with multiple daily injections).  no cbg record, but states cbg's are mostly in the low-100's.  It varies from 75-379.  He says it depends on meals.  It is in general higher as the day goes on, but is also often low in the afternoon.   Past Medical History  Diagnosis Date  . Blindness   . HTN (hypertension)   . Diabetes mellitus   . Hyperlipidemia   . Tachycardia     resting; on low dose B-Blocker  . OSA (obstructive sleep apnea)     AHI 56.3/hr, loud snoring, desaturation to 73% on RA (Dr. Jetty Duhamellinton Young)  . GERD (gastroesophageal reflux disease)     Past Surgical History  Procedure Laterality Date  . Cholecystectomy    . Transthoracic echocardiogram  08/25/2010    normal; EF=>55%; mild-mod conc LVH; mild MR    History   Social History  . Marital Status: Married    Spouse Name: N/A    Number of Children: 2  . Years of Education: N/A   Occupational History  . disabled    Social History Main Topics  . Smoking status: Never Smoker   . Smokeless tobacco: Never Used  . Alcohol Use: Yes     Comment: couples times a year  . Drug Use: No  . Sexual Activity: Not on file   Other Topics Concern  . Not on file   Social History Narrative  . No narrative on file    Current Outpatient Prescriptions on File Prior to Visit  Medication Sig Dispense Refill  . BD PEN NEEDLE NANO U/F 32G X 4 MM MISC       . esomeprazole (NEXIUM) 40 MG capsule Take 40 mg by mouth 2  (two) times daily before a meal.       . famotidine (PEPCID) 20 MG tablet Take 20 mg by mouth at bedtime.      . fexofenadine (ALLEGRA) 180 MG tablet       . losartan (COZAAR) 100 MG tablet       . metoprolol succinate (TOPROL-XL) 25 MG 24 hr tablet Take 25 mg by mouth daily.       . pantoprazole (PROTONIX) 40 MG tablet       . PROAIR HFA 108 (90 BASE) MCG/ACT inhaler       . zolpidem (AMBIEN) 10 MG tablet Take 10 mg by mouth at bedtime as needed.       No current facility-administered medications on file prior to visit.    Allergies  Allergen Reactions  . Lisinopril     cough    Family History  Problem Relation Age of Onset  . Cancer Paternal Grandmother   . Diabetes Father   . Diabetes Brother   . Diabetes Maternal Grandmother     BP 136/96  Pulse 93  Temp(Src) 98.2 F (36.8 C) (Oral)  Ht 5\' 1"  (1.549 m)  Wt 222 lb (100.699 kg)  BMI 41.97 kg/m2  SpO2 94%   Review of Systems He denies hypoglycemia and weight change.    Objective:   Physical Exam VITAL SIGNS:  See vs page.  GENERAL: no distress.  SKIN:  Insulin injection sites at the anterior abdomen are normal.    Lab Results  Component Value Date   HGBA1C 9.5* 09/03/2013      Assessment & Plan:  DM; moderate exacerbation: we discussed the possibility of changing to a different type of insulin.  He says he wants to continue the same for now.   Blindness: whenever this patient changes insulins, he needs to see a DM educator to go over the new pen device.  Patient is advised the following: Patient Instructions  check your blood sugar twice a day.  vary the time of day when you check, between before the 3 meals, and at bedtime.  also check if you have symptoms of your blood sugar being too high or too low.  please keep a record of the readings and bring it to your next appointment here.  please call us sooner if your blood sugar goes below 70, or if you have a lot of readings over 200.  Please reduce the levemir,  to 180 units each morning.   On this type of insulin schedule, you should eat meals on a regular schedule, especially lunch.  If a meal is missed or significantly delayed, your blood sugar could go low.   Please come back for a follow-up appointment in 6 weeks.

## 2014-06-23 NOTE — Patient Instructions (Addendum)
check your blood sugar twice a day.  vary the time of day when you check, between before the 3 meals, and at bedtime.  also check if you have symptoms of your blood sugar being too high or too low.  please keep a record of the readings and bring it to your next appointment here.  please call us sooner if your blood sugar goes below 70, or if you have a lot of readings over 200.  Please reduce the levemir, to 180 units each morning.   On this type of insulin schedule, you should eat meals on a regular schedule, especially lunch.  If a meal is missed or significantly delayed, your blood sugar could go low.   Please come back for a follow-up appointment in 6 weeks.

## 2014-06-24 NOTE — Progress Notes (Signed)
Pt. Reports that he is taking his blood sugars are "much better" since the previous visit.   He is insulin as directed q AM.  He will draw up a full pen X2 if the pen allows, and inject that amount, and then listen for the clicks to add the remainder for a total of 200u.   His meter is voice activated, but not able to download.  His date/time were not set correctly.  I changed that to reading the correct date/time.  Memory is only the last 20 readings.  Pt. Reports that FBSs are usually 120 or less.  Meter memory shows 2 over 150.  He reports that blood sugars are dropping the low 70s before supper.  He does not eat a mid day meals, becuause he says he is not hungry.  He is waking up at 9-10,and does not eat bfast until 10-11AM.  His supper meal is around 5-6PM.   Discussed the need to eat a small snack of protein and one carb serving around 2PM to prevent the low blood sugars.  Suggestions were given for this. He had no final questions

## 2014-06-24 NOTE — Patient Instructions (Signed)
Eat small snack at 2-3PM of one ounce of protein and 1 serving of carbohydrate.   Test blood sugars before meals/snacks and at bedtime.   Call if questions.

## 2014-08-04 ENCOUNTER — Encounter: Payer: Self-pay | Admitting: Endocrinology

## 2014-08-04 ENCOUNTER — Ambulatory Visit (INDEPENDENT_AMBULATORY_CARE_PROVIDER_SITE_OTHER): Payer: Medicare Other | Admitting: Endocrinology

## 2014-08-04 VITALS — BP 124/80 | HR 94 | Temp 98.5°F | Ht 61.0 in | Wt 222.0 lb

## 2014-08-04 DIAGNOSIS — E109 Type 1 diabetes mellitus without complications: Secondary | ICD-10-CM

## 2014-08-04 LAB — HEMOGLOBIN A1C: Hgb A1c MFr Bld: 9.1 % — ABNORMAL HIGH (ref 4.6–6.5)

## 2014-08-04 NOTE — Progress Notes (Signed)
Subjective:    Patient ID: Andrew [name], male    DOB: 1956/07/23, 58 y.o.   MRN: [id number]  HPI pt returns for f/u of insulin-requiring DM (dx'ed 1991, when he presented with severe hyperglycemia (glucose was 1300); he has mild if any neuropathy of the lower extremities; he is unaware of any associated chronic complications; he has been on insulin since approx 2008; he has a talking cbg meter; he has never had pancreatitis, severe hypoglycemia, or DKA; he was changed to a simple qd insulin regimen, due to poor results with multiple daily injections). no cbg record, but states cbg's vary from 62-200's.  It is in general higher as the day goes on.  pt states he feels well in general. Past Medical History  Diagnosis Date  . Blindness   . HTN (hypertension)   . Diabetes mellitus   . Hyperlipidemia   . Tachycardia     resting; on low dose B-Blocker  . OSA (obstructive sleep apnea)     AHI 56.3/hr, loud snoring, desaturation to 73% on RA (Dr. Jetty Duhamel)  . GERD (gastroesophageal reflux disease)     Past Surgical History  Procedure Laterality Date  . Cholecystectomy    . Transthoracic echocardiogram  08/25/2010    normal; EF=>55%; mild-mod conc LVH; mild MR    History   Social History  . Marital Status: Married    Spouse Name: N/A    Number of Children: 2  . Years of Education: N/A   Occupational History  . disabled    Social History Main Topics  . Smoking status: Never Smoker   . Smokeless tobacco: Never Used  . Alcohol Use: Yes     Comment: couples times a year  . Drug Use: No  . Sexual Activity: Not on file   Other Topics Concern  . Not on file   Social History Narrative  . No narrative on file    Current Outpatient Prescriptions on File Prior to Visit  Medication Sig Dispense Refill  . BD PEN NEEDLE NANO U/F 32G X 4 MM MISC       . esomeprazole (NEXIUM) 40 MG capsule Take 40 mg by mouth 2 (two) times daily before a meal.       . famotidine (PEPCID) 20 MG  tablet Take 20 mg by mouth at bedtime.      . fexofenadine (ALLEGRA) 180 MG tablet       . Insulin Detemir (LEVEMIR) 100 UNIT/ML Pen Inject 180 Units into the skin every morning. And pen needles 1/day.      . losartan (COZAAR) 100 MG tablet       . metoprolol succinate (TOPROL-XL) 25 MG 24 hr tablet Take 25 mg by mouth daily.       . pantoprazole (PROTONIX) 40 MG tablet       . PROAIR HFA 108 (90 BASE) MCG/ACT inhaler       . zolpidem (AMBIEN) 10 MG tablet Take 10 mg by mouth at bedtime as needed.       No current facility-administered medications on file prior to visit.    Allergies  Allergen Reactions  . Lisinopril     cough    Family History  Problem Relation Age of Onset  . Cancer Paternal Grandmother   . Diabetes Father   . Diabetes Brother   . Diabetes Maternal Grandmother     BP 124/80  Pulse 94  Temp(Src) 98.5 F (36.9 C) (Oral)  Ht 5\' 1"  (1.549  m)  Wt 222 lb (100.699 kg)  BMI 41.97 kg/m2  SpO2 96%    Review of Systems Denies LOC and weight change.      Objective:   Physical Exam VITAL SIGNS:  See vs page GENERAL: no distress Pulses: dorsalis pedis intact bilat.  Feet: no deformity. feet are of normal color and temp. 1+ bilat leg edema. There is bilateral onychomycosis.  Skin: no ulcer on the feet.  Neuro: sensation is intact to touch on the feet.     Lab Results  Component Value Date   HGBA1C 9.1* 08/04/2014      Assessment & Plan:  DM: severe exacerbation Noncompliance with cbg recording: I'll work around this as best I can.  In view of his blindness, i asked only that he bring his meter, so we can download or transcribe.     Patient is advised the following: Patient Instructions  check your blood sugar twice a day.  vary the time of day when you check, between before the 3 meals, and at bedtime.  also check if you have symptoms of your blood sugar being too high or too low.  please keep a record of the readings and bring it to your next  appointment here.  please call us sooner if your blood sugar goes below 70, or if you have a lot of readings over 200.  A diabetes blood test is requested for you today.  We'll contact you with results.  On this type of insulin schedule, you should eat meals on a regular schedule, especially lunch.  If a meal is missed or significantly delayed, your blood sugar could go low.   Please come back for a follow-up appointment in 3 months.

## 2014-08-04 NOTE — Patient Instructions (Addendum)
check your blood sugar twice a day.  vary the time of day when you check, between before the 3 meals, and at bedtime.  also check if you have symptoms of your blood sugar being too high or too low.  please keep a record of the readings and bring it to your next appointment here.  please call us sooner if your blood sugar goes below 70, or if you have a lot of readings over 200.  A diabetes blood test is requested for you today.  We'll contact you with results.  On this type of insulin schedule, you should eat meals on a regular schedule, especially lunch.  If a meal is missed or significantly delayed, your blood sugar could go low.   Please come back for a follow-up appointment in 3 months.

## 2014-09-14 ENCOUNTER — Ambulatory Visit: Payer: Medicare Other | Admitting: Podiatry

## 2014-09-21 ENCOUNTER — Ambulatory Visit (INDEPENDENT_AMBULATORY_CARE_PROVIDER_SITE_OTHER): Payer: Medicare Other | Admitting: Podiatry

## 2014-09-21 DIAGNOSIS — B351 Tinea unguium: Secondary | ICD-10-CM

## 2014-09-21 DIAGNOSIS — M79676 Pain in unspecified toe(s): Secondary | ICD-10-CM

## 2014-09-21 NOTE — Patient Instructions (Addendum)
Apply over-the-counter clotrimazole cream to the web spaces of your toes daily

## 2014-09-21 NOTE — Progress Notes (Signed)
Patient ID: Andrew NeedsJohnny R [name], male   DOB: 1956/09/20, 58 y.o.   MRN: [id number]  Subjective: This patient presents complaining of painful toenails and itching web spaces  Objective: Visually impaired black male The webspaces demonstrate no skin lesions The toenails are hypertrophic, elongated, discolored 6-10  Assessment: Symptomatic onychomycoses x10 Possible low-grade tinea in webspaces, however, no obvious clinical signs  Plan: Debrided toenails x10 without a bleeding  Apply over-the-counter clotrimazole cream to the web spaces of your toes daily  Reappoint x3 months

## 2014-10-30 ENCOUNTER — Encounter: Payer: Self-pay | Admitting: Endocrinology

## 2014-10-30 ENCOUNTER — Ambulatory Visit (INDEPENDENT_AMBULATORY_CARE_PROVIDER_SITE_OTHER): Payer: Medicare Other | Admitting: Endocrinology

## 2014-10-30 VITALS — BP 134/86 | HR 97 | Temp 97.8°F | Ht 61.0 in | Wt 223.0 lb

## 2014-10-30 DIAGNOSIS — E109 Type 1 diabetes mellitus without complications: Secondary | ICD-10-CM

## 2014-10-30 MED ORDER — INSULIN DETEMIR 100 UNIT/ML FLEXPEN: 200.0000 [IU] | PEN_INJECTOR | SUBCUTANEOUS | Status: DC

## 2014-10-30 NOTE — Patient Instructions (Addendum)
check your blood sugar twice a day.  vary the time of day when you check, between before the 3 meals, and at bedtime.  also check if you have symptoms of your blood sugar being too high or too low.  please keep a record of the readings and bring it to your next appointment here.  please call us sooner if your blood sugar goes below 70, or if you have a lot of readings over 200.  Please sign a release of information for your recent blood tests.  Please increase the insulin to 200 units each morning.   On this type of insulin schedule, you should eat meals on a regular schedule, especially lunch.  If a meal is missed or significantly delayed, your blood sugar could go low.   Please come back for a follow-up appointment in 3 months.  Please bring your meter.

## 2014-10-30 NOTE — Progress Notes (Signed)
Subjective:    Patient ID: Andrew [name], male    DOB: 02-22-1956, 58 y.o.   MRN: [id number]  HPI  Pt returns for f/u of diabetes mellitus: DM type: Insulin-requiring type 2 Dx'ed: 1991, when he presented with severe hyperglycemia (glucose was 1300).   Complications: none Therapy: insulin since 2008 DKA: never Severe hypoglycemia: never Pancreatitis: never Other: he has a talking cbg meter; he was changed to a simple qd insulin regimen, due to poor results with multiple daily injections.   Interval history: no cbg record or meter, but states cbg's vary from the 120-200's.  There is no trend throughout the day.  pt states he feels well in general.  Pt says he never misses the insulin.   Past Medical History  Diagnosis Date  . Blindness   . HTN (hypertension)   . Diabetes mellitus   . Hyperlipidemia   . Tachycardia     resting; on low dose B-Blocker  . OSA (obstructive sleep apnea)     AHI 56.3/hr, loud snoring, desaturation to 73% on RA (Dr. Jetty Duhamellinton Young)  . GERD (gastroesophageal reflux disease)     Past Surgical History  Procedure Laterality Date  . Cholecystectomy    . Transthoracic echocardiogram  08/25/2010    normal; EF=>55%; mild-mod conc LVH; mild MR    History   Social History  . Marital Status: Married    Spouse Name: N/A    Number of Children: 2  . Years of Education: N/A   Occupational History  . disabled    Social History Main Topics  . Smoking status: Never Smoker   . Smokeless tobacco: Never Used  . Alcohol Use: Yes     Comment: couples times a year  . Drug Use: No  . Sexual Activity: Not on file   Other Topics Concern  . Not on file   Social History Narrative    Current Outpatient Prescriptions on File Prior to Visit  Medication Sig Dispense Refill  . BD PEN NEEDLE NANO U/F 32G X 4 MM MISC     . esomeprazole (NEXIUM) 40 MG capsule Take 40 mg by mouth 2 (two) times daily before a meal.     . famotidine (PEPCID) 20 MG tablet Take 20 mg  by mouth at bedtime.    . fexofenadine (ALLEGRA) 180 MG tablet     . losartan (COZAAR) 100 MG tablet     . metoprolol succinate (TOPROL-XL) 25 MG 24 hr tablet Take 25 mg by mouth daily.     . pantoprazole (PROTONIX) 40 MG tablet     . PROAIR HFA 108 (90 BASE) MCG/ACT inhaler     . zolpidem (AMBIEN) 10 MG tablet Take 10 mg by mouth at bedtime as needed.     No current facility-administered medications on file prior to visit.    Allergies  Allergen Reactions  . Lisinopril     cough    Family History  Problem Relation Age of Onset  . Cancer Paternal Grandmother   . Diabetes Father   . Diabetes Brother   . Diabetes Maternal Grandmother     BP 134/86 mmHg  Pulse 97  Temp(Src) 97.8 F (36.6 C) (Oral)  Ht 5\' 1"  (1.549 m)  Wt 223 lb (101.152 kg)  BMI 42.16 kg/m2  SpO2 96%  Review of Systems He denies hypoglycemia.  He has gained weight.       Objective:   Physical Exam VITAL SIGNS:  See vs page GENERAL: no distress  Pulses: dorsalis pedis intact bilat.  Feet: no deformity. feet are of normal color and temp. 1+ bilat leg edema. There is bilateral onychomycosis.  Skin: no ulcer on the feet.  Neuro: sensation is intact to touch on the feet.    Lab Results  Component Value Date   HGBA1C 9.1* 08/04/2014   i have reviewed the following old records: cardiol notes: pt was seen for resting tachycardia, and was rx'ed with b-blocker  i reviewed electrocardiogram (08/20/13)    Assessment & Plan:  DM: moderate exacerbation.  He may need a faster-acting insulin.   Noncompliance with bringing cbg meter: I'll work around this as best I can.   Tachycardia: improved on b-blocker. Instructions are read to patient.   Patient is advised the following: Patient Instructions  check your blood sugar twice a day.  vary the time of day when you check, between before the 3 meals, and at bedtime.  also check if you have symptoms of your blood sugar being too high or too low.  please keep a  record of the readings and bring it to your next appointment here.  please call us sooner if your blood sugar goes below 70, or if you have a lot of readings over 200.  Please sign a release of information for your recent blood tests.  Please increase the insulin to 200 units each morning.   On this type of insulin schedule, you should eat meals on a regular schedule, especially lunch.  If a meal is missed or significantly delayed, your blood sugar could go low.   Please come back for a follow-up appointment in 3 months.  Please bring your meter.

## 2014-12-21 ENCOUNTER — Encounter: Payer: Self-pay | Admitting: Endocrinology

## 2014-12-28 ENCOUNTER — Encounter: Payer: Self-pay | Admitting: Podiatry

## 2014-12-28 ENCOUNTER — Ambulatory Visit (INDEPENDENT_AMBULATORY_CARE_PROVIDER_SITE_OTHER): Payer: Medicare Other | Admitting: Podiatry

## 2014-12-28 DIAGNOSIS — B351 Tinea unguium: Secondary | ICD-10-CM

## 2014-12-28 DIAGNOSIS — M79676 Pain in unspecified toe(s): Secondary | ICD-10-CM | POA: Diagnosis not present

## 2014-12-28 NOTE — Patient Instructions (Signed)
Diabetes and Foot Care Diabetes may cause you to have problems because of poor blood supply (circulation) to your feet and legs. This may cause the skin on your feet to become thinner, break easier, and heal more slowly. Your skin may become dry, and the skin may peel and crack. You may also have nerve damage in your legs and feet causing decreased feeling in them. You may not notice minor injuries to your feet that could lead to infections or more serious problems. Taking care of your feet is one of the most important things you can do for yourself.  HOME CARE INSTRUCTIONS  Wear shoes at all times, even in the house. Do not go barefoot. Bare feet are easily injured.  Check your feet daily for blisters, cuts, and redness. If you cannot see the bottom of your feet, use a mirror or ask someone for help.  Wash your feet with warm water (do not use hot water) and mild soap. Then pat your feet and the areas between your toes until they are completely dry. Do not soak your feet as this can dry your skin.  Apply a moisturizing lotion or petroleum jelly (that does not contain alcohol and is unscented) to the skin on your feet and to dry, brittle toenails. Do not apply lotion between your toes.  Trim your toenails straight across. Do not dig under them or around the cuticle. File the edges of your nails with an emery board or nail file.  Do not cut corns or calluses or try to remove them with medicine.  Wear clean socks or stockings every day. Make sure they are not too tight. Do not wear knee-high stockings since they may decrease blood flow to your legs.  Wear shoes that fit properly and have enough cushioning. To break in new shoes, wear them for just a few hours a day. This prevents you from injuring your feet. Always look in your shoes before you put them on to be sure there are no objects inside.  Do not cross your legs. This may decrease the blood flow to your feet.  If you find a minor scrape,  cut, or break in the skin on your feet, keep it and the skin around it clean and dry. These areas may be cleansed with mild soap and water. Do not cleanse the area with peroxide, alcohol, or iodine.  When you remove an adhesive bandage, be sure not to damage the skin around it.  If you have a wound, look at it several times a day to make sure it is healing.  Do not use heating pads or hot water bottles. They may burn your skin. If you have lost feeling in your feet or legs, you may not know it is happening until it is too late.  Make sure your health care provider performs a complete foot exam at least annually or more often if you have foot problems. Report any cuts, sores, or bruises to your health care provider immediately. SEEK MEDICAL CARE IF:   You have an injury that is not healing.  You have cuts or breaks in the skin.  You have an ingrown nail.  You notice redness on your legs or feet.  You feel burning or tingling in your legs or feet.  You have pain or cramps in your legs and feet.  Your legs or feet are numb.  Your feet always feel cold. SEEK IMMEDIATE MEDICAL CARE IF:   There is increasing redness,   swelling, or pain in or around a wound.  There is a red line that goes up your leg.  Pus is coming from a wound.  You develop a fever or as directed by your health care provider.  You notice a bad smell coming from an ulcer or wound. Document Released: 12/01/2000 Document Revised: 08/06/2013 Document Reviewed: 05/13/2013 Exit[employer] Patient Information 2015 [employer]. This information is not intended to replace advice given to you by your health care provider. Make sure you discuss any questions you have with your health care provider.  

## 2014-12-28 NOTE — Progress Notes (Signed)
Patient ID: Andrew NeedsJohnny R [name], male   DOB: [birth year], 59 y.o.   MRN: [id number]  Subjective: This patient presents again complaining of painful toenails and walking wearing shoes  Objective: Visually impaired male The toenails are hypertrophic, brittle, incurvated, discolored and tender to palpation 6-10  Assessment: Symptomatic onychomycoses 6-10 Type I diabetic  Plan: Debrided toenails 10 without a bleeding  Reappoint 3 months

## 2015-01-22 ENCOUNTER — Other Ambulatory Visit (HOSPITAL_COMMUNITY): Payer: Self-pay | Admitting: Internal Medicine

## 2015-01-22 ENCOUNTER — Ambulatory Visit (HOSPITAL_COMMUNITY)
Admission: RE | Admit: 2015-01-22 | Discharge: 2015-01-22 | Disposition: A | Discharge status: Home / Self Care | Payer: Medicare Other | Source: Ambulatory Visit | Attending: Internal Medicine | Admitting: Internal Medicine

## 2015-01-22 DIAGNOSIS — J988 Other specified respiratory disorders: Secondary | ICD-10-CM

## 2015-01-22 DIAGNOSIS — J449 Chronic obstructive pulmonary disease, unspecified: Secondary | ICD-10-CM | POA: Diagnosis present

## 2015-01-29 ENCOUNTER — Encounter: Payer: Self-pay | Admitting: Endocrinology

## 2015-01-29 ENCOUNTER — Ambulatory Visit (INDEPENDENT_AMBULATORY_CARE_PROVIDER_SITE_OTHER): Payer: Medicare Other | Admitting: Endocrinology

## 2015-01-29 VITALS — BP 136/82 | HR 109 | Temp 98.2°F | Ht 61.0 in | Wt 222.0 lb

## 2015-01-29 DIAGNOSIS — E109 Type 1 diabetes mellitus without complications: Secondary | ICD-10-CM

## 2015-01-29 LAB — BASIC METABOLIC PANEL
BUN: 20 mg/dL (ref 6–23)
CALCIUM: 9.5 mg/dL (ref 8.4–10.5)
CO2: 33 meq/L — AB (ref 19–32)
CREATININE: 1.26 mg/dL (ref 0.40–1.50)
Chloride: 108 mEq/L (ref 96–112)
GFR: 75.62 mL/min (ref >60.00)
Glucose, Bld: 103 mg/dL — ABNORMAL HIGH (ref 70–99)
Potassium: 4 mEq/L (ref 3.5–5.1)
SODIUM: 143 meq/L (ref 135–145)

## 2015-01-29 LAB — TSH: TSH: 2.43 u[IU]/mL (ref 0.35–4.50)

## 2015-01-29 LAB — MICROALBUMIN / CREATININE URINE RATIO
CREATININE, U: 120.2 mg/dL
Microalb Creat Ratio: 3.6 mg/g (ref 0.0–30.0)
Microalb, Ur: 4.3 mg/dL — ABNORMAL HIGH (ref 0.0–1.9)

## 2015-01-29 LAB — HEMOGLOBIN A1C: HEMOGLOBIN A1C: 10.6 % — AB (ref 4.6–6.5)

## 2015-01-29 NOTE — Patient Instructions (Addendum)
check your blood sugar twice a day.  vary the time of day when you check, between before the 3 meals, and at bedtime.  also check if you have symptoms of your blood sugar being too high or too low.  please keep a record of the readings and bring it to your next appointment here.  please call us sooner if your blood sugar goes below 70, or if you have a lot of readings over 200.  Blood and urine tests are being requested for you today.  We'll let you know about the results.   On this type of insulin schedule, you should eat meals on a regular schedule, especially lunch.  If a meal is missed or significantly delayed, your blood sugar could go low.  Also, you should carry a non-perishable snack, just in case.   Please come back for a follow-up appointment in 3 months.  Please bring your meter.

## 2015-01-29 NOTE — Progress Notes (Signed)
Subjective:    Patient ID: Andrew [name], male    DOB: 1956-11-09, 59 y.o.   MRN: [id number]  HPI Pt returns for f/u of diabetes mellitus: DM type: Insulin-requiring type 2 Dx'ed: 1991, when he presented with severe hyperglycemia (glucose was 1300).   Complications: none Therapy: insulin since 2008. DKA: never Severe hypoglycemia: never. Pancreatitis: never Other: he has a talking cbg meter; he was changed to a simple qd insulin regimen, due to poor results with multiple daily injections.   Interval history: he brings his cbg meter, which i have reviewed today.  It varies from 57-200's.  It is lowest at lunch, and higher at other times.  pt states he feels well in general.  Pt says he never misses the insulin.   Past Medical History  Diagnosis Date  . Blindness   . HTN (hypertension)   . Diabetes mellitus   . Hyperlipidemia   . Tachycardia     resting; on low dose B-Blocker  . OSA (obstructive sleep apnea)     AHI 56.3/hr, loud snoring, desaturation to 73% on RA (Dr. Jetty Duhamellinton Young)  . GERD (gastroesophageal reflux disease)     Past Surgical History  Procedure Laterality Date  . Cholecystectomy    . Transthoracic echocardiogram  08/25/2010    normal; EF=>55%; mild-mod conc LVH; mild MR    History   Social History  . Marital Status: Married    Spouse Name: N/A  . Number of Children: 2  . Years of Education: N/A   Occupational History  . disabled    Social History Main Topics  . Smoking status: Never Smoker   . Smokeless tobacco: Never Used  . Alcohol Use: Yes     Comment: couples times a year  . Drug Use: No  . Sexual Activity: Not on file   Other Topics Concern  . Not on file   Social History Narrative    Current Outpatient Prescriptions on File Prior to Visit  Medication Sig Dispense Refill  . BD PEN NEEDLE NANO U/F 32G X 4 MM MISC     . esomeprazole (NEXIUM) 40 MG capsule Take 40 mg by mouth 2 (two) times daily before a meal.     . famotidine  (PEPCID) 20 MG tablet Take 20 mg by mouth at bedtime.    . fexofenadine (ALLEGRA) 180 MG tablet     . Insulin Detemir (LEVEMIR FLEXTOUCH) 100 UNIT/ML Pen Inject 200 Units into the skin every morning. and pen needles 1/day 75 mL 11  . losartan (COZAAR) 100 MG tablet     . metoprolol succinate (TOPROL-XL) 25 MG 24 hr tablet Take 25 mg by mouth daily.     . pantoprazole (PROTONIX) 40 MG tablet     . PROAIR HFA 108 (90 BASE) MCG/ACT inhaler     . zolpidem (AMBIEN) 10 MG tablet Take 10 mg by mouth at bedtime as needed.     No current facility-administered medications on file prior to visit.    Allergies  Allergen Reactions  . Lisinopril     cough    Family History  Problem Relation Age of Onset  . Cancer Paternal Grandmother   . Diabetes Father   . Diabetes Brother   . Diabetes Maternal Grandmother     BP 136/82 mmHg  Pulse 109  Temp(Src) 98.2 F (36.8 C) (Oral)  Ht 5\' 1"  (1.549 m)  Wt 222 lb (100.699 kg)  BMI 41.97 kg/m2  SpO2 98%  Review of Systems  He denies LOC and weight change.    Objective:   Physical Exam VITAL SIGNS:  See vs page GENERAL: no distress Pulses: dorsalis pedis intact bilat.   MSK: no deformity of the feet CV: no leg edema Skin:  no ulcer on the feet.  normal color and temp on the feet. Neuro: sensation is intact to touch on the feet.    Lab Results  Component Value Date   HGBA1C 10.6* 01/29/2015       Assessment & Plan:  DM: much worse: a1c is much higher than would be suggested by his cbg's.  Side-effect of rx: hypoglycemia   Patient is advised the following: Patient Instructions  check your blood sugar twice a day.  vary the time of day when you check, between before the 3 meals, and at bedtime.  also check if you have symptoms of your blood sugar being too high or too low.  please keep a record of the readings and bring it to your next appointment here.  please call us sooner if your blood sugar goes below 70, or if you have a lot of  readings over 200.  Blood and urine tests are being requested for you today.  We'll let you know about the results.   On this type of insulin schedule, you should eat meals on a regular schedule, especially lunch.  If a meal is missed or significantly delayed, your blood sugar could go low.  Also, you should carry a non-perishable snack, just in case.   Please come back for a follow-up appointment in 3 months.  Please bring your meter.    addendum: i advised pt change to qd lantus only.

## 2015-02-02 ENCOUNTER — Telehealth: Payer: Self-pay | Admitting: Endocrinology

## 2015-02-02 NOTE — Telephone Encounter (Signed)
Pt advised of recent lab work. Documented in lab results.

## 2015-02-02 NOTE — Telephone Encounter (Signed)
Pt returning call regarding labs °

## 2015-02-05 ENCOUNTER — Other Ambulatory Visit: Payer: Self-pay | Admitting: Endocrinology

## 2015-02-05 ENCOUNTER — Telehealth: Payer: Self-pay | Admitting: Endocrinology

## 2015-02-05 MED ORDER — INSULIN DETEMIR 100 UNIT/ML FLEXPEN: 230.0000 [IU] | PEN_INJECTOR | SUBCUTANEOUS | Status: DC

## 2015-02-05 NOTE — Telephone Encounter (Signed)
Contacted pt and discussed continuing Levemir at a higher dosage. Pt agreed to continuing medication at a higher dosage. Waiting on response from MD on what dosage should be.

## 2015-02-05 NOTE — Telephone Encounter (Signed)
Pt needs to speak with [facility]

## 2015-03-29 ENCOUNTER — Ambulatory Visit (INDEPENDENT_AMBULATORY_CARE_PROVIDER_SITE_OTHER): Payer: Medicare Other | Admitting: Podiatry

## 2015-03-29 ENCOUNTER — Encounter: Payer: Self-pay | Admitting: Podiatry

## 2015-03-29 DIAGNOSIS — M79676 Pain in unspecified toe(s): Secondary | ICD-10-CM

## 2015-03-29 DIAGNOSIS — B351 Tinea unguium: Secondary | ICD-10-CM

## 2015-03-29 NOTE — Patient Instructions (Signed)
Diabetes and Foot Care Diabetes may cause you to have problems because of poor blood supply (circulation) to your feet and legs. This may cause the skin on your feet to become thinner, break easier, and heal more slowly. Your skin may become dry, and the skin may peel and crack. You may also have nerve damage in your legs and feet causing decreased feeling in them. You may not notice minor injuries to your feet that could lead to infections or more serious problems. Taking care of your feet is one of the most important things you can do for yourself.  HOME CARE INSTRUCTIONS  Wear shoes at all times, even in the house. Do not go barefoot. Bare feet are easily injured.  Check your feet daily for blisters, cuts, and redness. If you cannot see the bottom of your feet, use a mirror or ask someone for help.  Wash your feet with warm water (do not use hot water) and mild soap. Then pat your feet and the areas between your toes until they are completely dry. Do not soak your feet as this can dry your skin.  Apply a moisturizing lotion or petroleum jelly (that does not contain alcohol and is unscented) to the skin on your feet and to dry, brittle toenails. Do not apply lotion between your toes.  Trim your toenails straight across. Do not dig under them or around the cuticle. File the edges of your nails with an emery board or nail file.  Do not cut corns or calluses or try to remove them with medicine.  Wear clean socks or stockings every day. Make sure they are not too tight. Do not wear knee-high stockings since they may decrease blood flow to your legs.  Wear shoes that fit properly and have enough cushioning. To break in new shoes, wear them for just a few hours a day. This prevents you from injuring your feet. Always look in your shoes before you put them on to be sure there are no objects inside.  Do not cross your legs. This may decrease the blood flow to your feet.  If you find a minor scrape,  cut, or break in the skin on your feet, keep it and the skin around it clean and dry. These areas may be cleansed with mild soap and water. Do not cleanse the area with peroxide, alcohol, or iodine.  When you remove an adhesive bandage, be sure not to damage the skin around it.  If you have a wound, look at it several times a day to make sure it is healing.  Do not use heating pads or hot water bottles. They may burn your skin. If you have lost feeling in your feet or legs, you may not know it is happening until it is too late.  Make sure your health care provider performs a complete foot exam at least annually or more often if you have foot problems. Report any cuts, sores, or bruises to your health care provider immediately. SEEK MEDICAL CARE IF:   You have an injury that is not healing.  You have cuts or breaks in the skin.  You have an ingrown nail.  You notice redness on your legs or feet.  You feel burning or tingling in your legs or feet.  You have pain or cramps in your legs and feet.  Your legs or feet are numb.  Your feet always feel cold. SEEK IMMEDIATE MEDICAL CARE IF:   There is increasing redness,   swelling, or pain in or around a wound.  There is a red line that goes up your leg.  Pus is coming from a wound.  You develop a fever or as directed by your health care provider.  You notice a bad smell coming from an ulcer or wound. Document Released: 12/01/2000 Document Revised: 08/06/2013 Document Reviewed: 05/13/2013 Exit[employer] Patient Information 2015 [employer]. This information is not intended to replace advice given to you by your health care provider. Make sure you discuss any questions you have with your health care provider.  

## 2015-03-30 NOTE — Progress Notes (Signed)
Patient ID: Andrew [name], male   DOB: [birth year], 59 y.o.   MRN: [id number]  Subjective: This patient presents for ongoing debridement of painful toenails when walking wearing shoes and requests toenail debridement  Objective: Visually impaired patient Orientated 3 The toenails are hypertrophic, brittle, discolored, incurvated and tender to palpation 6-10  Assessment: Symptomatic onychomycoses 6-10 Type I diabetic  Plan: Debridement toenails 10 without any bleeding  Reappoint 3 months

## 2015-04-29 ENCOUNTER — Ambulatory Visit (INDEPENDENT_AMBULATORY_CARE_PROVIDER_SITE_OTHER): Payer: Medicare Other | Admitting: Endocrinology

## 2015-04-29 ENCOUNTER — Encounter: Payer: Self-pay | Admitting: Endocrinology

## 2015-04-29 VITALS — BP 124/88 | HR 96 | Temp 98.6°F | Resp 12 | Wt 225.0 lb

## 2015-04-29 DIAGNOSIS — E109 Type 1 diabetes mellitus without complications: Secondary | ICD-10-CM | POA: Diagnosis not present

## 2015-04-29 DIAGNOSIS — E1165 Type 2 diabetes mellitus with hyperglycemia: Secondary | ICD-10-CM | POA: Diagnosis not present

## 2015-04-29 LAB — HEMOGLOBIN A1C: Hgb A1c MFr Bld: 9.7 % — ABNORMAL HIGH (ref 4.6–6.5)

## 2015-04-29 MED ORDER — INSULIN DETEMIR 100 UNIT/ML FLEXPEN: 270.0000 [IU] | PEN_INJECTOR | SUBCUTANEOUS | Status: DC

## 2015-04-29 NOTE — Progress Notes (Signed)
Subjective:    Patient ID: Andrew [name], male    DOB: [birth year], 59 y.o.   MRN: [id number]  HPI Pt returns for f/u of diabetes mellitus: DM type: Insulin-requiring type 2 Dx'ed: 1991, when he presented with severe hyperglycemia (glucose was 1300).   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 DKA: never Severe hypoglycemia: never. Pancreatitis: never Other: he has a talking cbg meter; he was changed to a simple qd insulin regimen, due to poor results with multiple daily injections.   Interval history: He takes 230 units qam.  Pt says he never misses the insulin.  no cbg record, but states cbg's vary from 80-200's.  There is no trend throughout the day.   Past Medical History  Diagnosis Date  . Blindness   . HTN (hypertension)   . Diabetes mellitus   . Hyperlipidemia   . Tachycardia     resting; on low dose B-Blocker  . OSA (obstructive sleep apnea)     AHI 56.3/hr, loud snoring, desaturation to 73% on RA (Dr. Jetty Duhamellinton Young)  . GERD (gastroesophageal reflux disease)     Past Surgical History  Procedure Laterality Date  . Cholecystectomy    . Transthoracic echocardiogram  08/25/2010    normal; EF=>55%; mild-mod conc LVH; mild MR    History   Social History  . Marital Status: Married    Spouse Name: N/A  . Number of Children: 2  . Years of Education: N/A   Occupational History  . disabled    Social History Main Topics  . Smoking status: Never Smoker   . Smokeless tobacco: Never Used  . Alcohol Use: Yes     Comment: couples times a year  . Drug Use: No  . Sexual Activity: Not on file   Other Topics Concern  . Not on file   Social History Narrative    Current Outpatient Prescriptions on File Prior to Visit  Medication Sig Dispense Refill  . BD PEN NEEDLE NANO U/F 32G X 4 MM MISC     . esomeprazole (NEXIUM) 40 MG capsule Take 40 mg by mouth 2 (two) times daily before a meal.     . famotidine  (PEPCID) 20 MG tablet Take 20 mg by mouth at bedtime.    . fexofenadine (ALLEGRA) 180 MG tablet     . losartan (COZAAR) 100 MG tablet     . metoprolol succinate (TOPROL-XL) 25 MG 24 hr tablet Take 25 mg by mouth daily.     . pantoprazole (PROTONIX) 40 MG tablet     . PROAIR HFA 108 (90 BASE) MCG/ACT inhaler     . zolpidem (AMBIEN) 10 MG tablet Take 10 mg by mouth at bedtime as needed.     No current facility-administered medications on file prior to visit.    Allergies  Allergen Reactions  . Lisinopril     cough    Family History  Problem Relation Age of Onset  . Cancer Paternal Grandmother   . Diabetes Father   . Diabetes Brother   . Diabetes Maternal Grandmother     BP 124/88 mmHg  Pulse 96  Temp(Src) 98.6 F (37 C) (Oral)  Resp 12  Wt 225 lb (102.059 kg)  SpO2 97%   Review of Systems He denies hypoglycemia.      Objective:   Physical Exam VITAL SIGNS:  See vs page GENERAL: no distress Pulses: dorsalis pedis intact bilat.   MSK: no deformity of the  feet CV: 1+ bilat leg edema Skin:  no ulcer on the feet.  normal color and temp on the feet. Neuro: sensation is intact to touch on the feet Ext: There is bilateral onychomycosis of the toenails   Lab Results  Component Value Date   HGBA1C 9.7* 04/29/2015       Assessment & Plan:  DM: he needs increased rx   Patient is advised the following: Patient Instructions  check your blood sugar twice a day.  vary the time of day when you check, between before the 3 meals, and at bedtime.  also check if you have symptoms of your blood sugar being too high or too low.  please keep a record of the readings and bring it to your next appointment here.  please call us sooner if your blood sugar goes below 70, or if you have a lot of readings over 200.  A diabetes blood test is being requested for you today.  We'll let you know about the results.   On this type of insulin schedule, you should eat meals on a regular  schedule, especially lunch.  If a meal is missed or significantly delayed, your blood sugar could go low.  Also, you should carry a non-perishable snack, just in case.   Please come back for a follow-up appointment in 3 months.  Please bring your meter.    addendum: increase insulin to 270 units qam

## 2015-04-29 NOTE — Patient Instructions (Addendum)
check your blood sugar twice a day.  vary the time of day when you check, between before the 3 meals, and at bedtime.  also check if you have symptoms of your blood sugar being too high or too low.  please keep a record of the readings and bring it to your next appointment here.  please call us sooner if your blood sugar goes below 70, or if you have a lot of readings over 200.  A diabetes blood test is being requested for you today.  We'll let you know about the results.   On this type of insulin schedule, you should eat meals on a regular schedule, especially lunch.  If a meal is missed or significantly delayed, your blood sugar could go low.  Also, you should carry a non-perishable snack, just in case.   Please come back for a follow-up appointment in 3 months.  Please bring your meter.

## 2015-05-31 ENCOUNTER — Other Ambulatory Visit: Payer: Self-pay | Admitting: Endocrinology

## 2015-07-05 ENCOUNTER — Ambulatory Visit: Payer: Medicare Other | Admitting: Podiatry

## 2015-07-07 ENCOUNTER — Encounter: Payer: Self-pay | Admitting: Podiatry

## 2015-07-07 ENCOUNTER — Ambulatory Visit (INDEPENDENT_AMBULATORY_CARE_PROVIDER_SITE_OTHER): Payer: Medicare Other | Admitting: Podiatry

## 2015-07-07 DIAGNOSIS — E109 Type 1 diabetes mellitus without complications: Secondary | ICD-10-CM | POA: Diagnosis not present

## 2015-07-07 DIAGNOSIS — M79676 Pain in unspecified toe(s): Secondary | ICD-10-CM

## 2015-07-07 DIAGNOSIS — B351 Tinea unguium: Secondary | ICD-10-CM | POA: Diagnosis not present

## 2015-07-07 NOTE — Patient Instructions (Signed)
Diabetes and Foot Care Diabetes may cause you to have problems because of poor blood supply (circulation) to your feet and legs. This may cause the skin on your feet to become thinner, break easier, and heal more slowly. Your skin may become dry, and the skin may peel and crack. You may also have nerve damage in your legs and feet causing decreased feeling in them. You may not notice minor injuries to your feet that could lead to infections or more serious problems. Taking care of your feet is one of the most important things you can do for yourself.  HOME CARE INSTRUCTIONS  Wear shoes at all times, even in the house. Do not go barefoot. Bare feet are easily injured.  Check your feet daily for blisters, cuts, and redness. If you cannot see the bottom of your feet, use a mirror or ask someone for help.  Wash your feet with warm water (do not use hot water) and mild soap. Then pat your feet and the areas between your toes until they are completely dry. Do not soak your feet as this can dry your skin.  Apply a moisturizing lotion or petroleum jelly (that does not contain alcohol and is unscented) to the skin on your feet and to dry, brittle toenails. Do not apply lotion between your toes.  Trim your toenails straight across. Do not dig under them or around the cuticle. File the edges of your nails with an emery board or nail file.  Do not cut corns or calluses or try to remove them with medicine.  Wear clean socks or stockings every day. Make sure they are not too tight. Do not wear knee-high stockings since they may decrease blood flow to your legs.  Wear shoes that fit properly and have enough cushioning. To break in new shoes, wear them for just a few hours a day. This prevents you from injuring your feet. Always look in your shoes before you put them on to be sure there are no objects inside.  Do not cross your legs. This may decrease the blood flow to your feet.  If you find a minor scrape,  cut, or break in the skin on your feet, keep it and the skin around it clean and dry. These areas may be cleansed with mild soap and water. Do not cleanse the area with peroxide, alcohol, or iodine.  When you remove an adhesive bandage, be sure not to damage the skin around it.  If you have a wound, look at it several times a day to make sure it is healing.  Do not use heating pads or hot water bottles. They may burn your skin. If you have lost feeling in your feet or legs, you may not know it is happening until it is too late.  Make sure your health care provider performs a complete foot exam at least annually or more often if you have foot problems. Report any cuts, sores, or bruises to your health care provider immediately. SEEK MEDICAL CARE IF:   You have an injury that is not healing.  You have cuts or breaks in the skin.  You have an ingrown nail.  You notice redness on your legs or feet.  You feel burning or tingling in your legs or feet.  You have pain or cramps in your legs and feet.  Your legs or feet are numb.  Your feet always feel cold. SEEK IMMEDIATE MEDICAL CARE IF:   There is increasing redness,   swelling, or pain in or around a wound.  There is a red line that goes up your leg.  Pus is coming from a wound.  You develop a fever or as directed by your health care provider.  You notice a bad smell coming from an ulcer or wound. Document Released: 12/01/2000 Document Revised: 08/06/2013 Document Reviewed: 05/13/2013 Exit[employer] Patient Information 2015 [employer]. This information is not intended to replace advice given to you by your health care provider. Make sure you discuss any questions you have with your health care provider.  

## 2015-07-08 NOTE — Progress Notes (Signed)
Patient ID: LANG ZINGG, male   DOB: 1956-11-27, 59 y.o.   MRN: [id number]  Subjective: This patient presents for a scheduled visit complaining of painful toenails and requesting nail debridement  Objective: Patient visually impaired The toenails are hypertrophic, elongated, discolored and tender direct palpation  Assessment: Symptomatic onychomycosis 6-10 Type I diabetic  Plan: Debridement toenails 10 without any bleeding Patient will not allow me to use the electric bur  Reappoint 3 months

## 2015-07-30 ENCOUNTER — Ambulatory Visit: Payer: Medicare Other | Admitting: Endocrinology

## 2015-08-03 ENCOUNTER — Ambulatory Visit (INDEPENDENT_AMBULATORY_CARE_PROVIDER_SITE_OTHER): Payer: Medicare Other | Admitting: Endocrinology

## 2015-08-03 ENCOUNTER — Encounter: Payer: Self-pay | Admitting: Endocrinology

## 2015-08-03 VITALS — BP 140/82 | HR 88 | Temp 97.9°F | Ht 61.0 in | Wt 230.0 lb

## 2015-08-03 DIAGNOSIS — E109 Type 1 diabetes mellitus without complications: Secondary | ICD-10-CM

## 2015-08-03 LAB — POCT GLYCOSYLATED HEMOGLOBIN (HGB A1C): Hemoglobin A1C: 7.7

## 2015-08-03 NOTE — Patient Instructions (Addendum)
check your blood sugar twice a day.  vary the time of day when you check, between before the 3 meals, and at bedtime.  also check if you have symptoms of your blood sugar being too high or too low.  please keep a record of the readings and bring it to your next appointment here.  please call us sooner if your blood sugar goes below 70, or if you have a lot of readings over 200.  On this type of insulin schedule, you should eat meals on a regular schedule, especially lunch.  If a meal is missed or significantly delayed, your blood sugar could go low.  Also, you should carry a non-perishable snack, just in case.   Please come back for a follow-up appointment in 3-4 months.  Please bring your meter.  Please continue the same insulin.

## 2015-08-03 NOTE — Progress Notes (Signed)
Subjective:    Patient ID: Andrew [name], male    DOB: 1956/05/12, 59 y.o.   MRN: [id number]  HPI Pt returns for f/u of diabetes mellitus: DM type: Insulin-requiring type 2 Dx'ed: 1991, when he presented with severe hyperglycemia (glucose was 1300).   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 DKA: never Severe hypoglycemia: never. Pancreatitis: never Other: he has a talking cbg meter; he was changed to a simple qd insulin regimen, due to poor results with multiple daily injections.   Interval history: He takes 270 units qam.  Pt says he never misses the insulin.  he brings his meter, which i have reviewed.  cbg's from 100-170.  There is no trend throughout the day.  Past Medical History  Diagnosis Date  . Blindness   . HTN (hypertension)   . Diabetes mellitus   . Hyperlipidemia   . Tachycardia     resting; on low dose B-Blocker  . OSA (obstructive sleep apnea)     AHI 56.3/hr, loud snoring, desaturation to 73% on RA (Dr. Jetty Duhamel)  . GERD (gastroesophageal reflux disease)     Past Surgical History  Procedure Laterality Date  . Cholecystectomy    . Transthoracic echocardiogram  08/25/2010    normal; EF=>55%; mild-mod conc LVH; mild MR    Social History   Social History  . Marital Status: Married    Spouse Name: N/A  . Number of Children: 2  . Years of Education: N/A   Occupational History  . disabled    Social History Main Topics  . Smoking status: Never Smoker   . Smokeless tobacco: Never Used  . Alcohol Use: Yes     Comment: couples times a year  . Drug Use: No  . Sexual Activity: Not on file   Other Topics Concern  . Not on file   Social History Narrative    Current Outpatient Prescriptions on File Prior to Visit  Medication Sig Dispense Refill  . BD PEN NEEDLE NANO U/F 32G X 4 MM MISC USE AS DIRECTED 100 each 2  . esomeprazole (NEXIUM) 40 MG capsule Take 40 mg by mouth 2 (two) times  daily before a meal.     . famotidine (PEPCID) 20 MG tablet Take 20 mg by mouth at bedtime.    . fexofenadine (ALLEGRA) 180 MG tablet     . Insulin Detemir (LEVEMIR FLEXTOUCH) 100 UNIT/ML Pen Inject 270 Units into the skin every morning. and pen needles 1/day 105 mL 11  . losartan (COZAAR) 100 MG tablet     . metoprolol succinate (TOPROL-XL) 25 MG 24 hr tablet Take 25 mg by mouth daily.     . pantoprazole (PROTONIX) 40 MG tablet     . PROAIR HFA 108 (90 BASE) MCG/ACT inhaler     . SYMBICORT 160-4.5 MCG/ACT inhaler     . zolpidem (AMBIEN) 10 MG tablet Take 10 mg by mouth at bedtime as needed.     No current facility-administered medications on file prior to visit.    Allergies  Allergen Reactions  . Lisinopril     cough    Family History  Problem Relation Age of Onset  . Cancer Paternal Grandmother   . Diabetes Father   . Diabetes Brother   . Diabetes Maternal Grandmother     BP 140/82 mmHg  Pulse 88  Temp(Src) 97.9 F (36.6 C) (Oral)  Ht  (1.549 m)  Wt 230 lb (104.327 kg)  BMI 43.48 kg/m2  SpO2 93%  Review of Systems He denies hypoglycemia     Objective:   Physical Exam VITAL SIGNS:  See vs page GENERAL: no distress MSK: no deformity of the feet  CV: 2+ bilat leg edema  Skin: no ulcer on the feet. normal color and temp on the feet.  Neuro: sensation is intact to touch on the feet.  Ext: There is bilateral onychomycosis ofthe toenails.   A1c=7.7%    Assessment & Plan:  DM: this is the best control this pt should aim for, given this regimen, which does match insulin to her changing needs throughout the day  Patient is advised the following: Patient Instructions  check your blood sugar twice a day.  vary the time of day when you check, between before the 3 meals, and at bedtime.  also check if you have symptoms of your blood sugar being too high or too low.  please keep a record of the readings and bring it to your next appointment here.  please call us  sooner if your blood sugar goes below 70, or if you have a lot of readings over 200.  On this type of insulin schedule, you should eat meals on a regular schedule, especially lunch.  If a meal is missed or significantly delayed, your blood sugar could go low.  Also, you should carry a non-perishable snack, just in case.   Please come back for a follow-up appointment in 3-4 months.  Please bring your meter.  Please continue the same insulin.

## 2015-10-13 ENCOUNTER — Ambulatory Visit (INDEPENDENT_AMBULATORY_CARE_PROVIDER_SITE_OTHER): Payer: Medicare Other | Admitting: Podiatry

## 2015-10-13 ENCOUNTER — Encounter: Payer: Self-pay | Admitting: Podiatry

## 2015-10-13 DIAGNOSIS — B351 Tinea unguium: Secondary | ICD-10-CM

## 2015-10-13 DIAGNOSIS — M79676 Pain in unspecified toe(s): Secondary | ICD-10-CM

## 2015-10-13 NOTE — Progress Notes (Signed)
Patient ID: Andrew [name], male   DOB: Sep 29, 1956, 59 y.o.   MRN: [id number]  Subjective: This patient presents for a scheduled visit complaining of painful toenails and requesting nail debridement  Objective: Patient is blind No open skin lesions bilaterally The toenails are hypertrophic, discolored, deformed and tender to direct palpation 6-10  Assessment: Symptomatic onychomycoses 6-10 Type I diabetic  Plan: Patient will not allow the use of electrical bur Toenails 10 were debrided without any bleeding  Reappoint 3 months

## 2015-10-13 NOTE — Patient Instructions (Signed)
Diabetes and Foot Care Diabetes may cause you to have problems because of poor blood supply (circulation) to your feet and legs. This may cause the skin on your feet to become thinner, break easier, and heal more slowly. Your skin may become dry, and the skin may peel and crack. You may also have nerve damage in your legs and feet causing decreased feeling in them. You may not notice minor injuries to your feet that could lead to infections or more serious problems. Taking care of your feet is one of the most important things you can do for yourself.  HOME CARE INSTRUCTIONS  Wear shoes at all times, even in the house. Do not go barefoot. Bare feet are easily injured.  Check your feet daily for blisters, cuts, and redness. If you cannot see the bottom of your feet, use a mirror or ask someone for help.  Wash your feet with warm water (do not use hot water) and mild soap. Then pat your feet and the areas between your toes until they are completely dry. Do not soak your feet as this can dry your skin.  Apply a moisturizing lotion or petroleum jelly (that does not contain alcohol and is unscented) to the skin on your feet and to dry, brittle toenails. Do not apply lotion between your toes.  Trim your toenails straight across. Do not dig under them or around the cuticle. File the edges of your nails with an emery board or nail file.  Do not cut corns or calluses or try to remove them with medicine.  Wear clean socks or stockings every day. Make sure they are not too tight. Do not wear knee-high stockings since they may decrease blood flow to your legs.  Wear shoes that fit properly and have enough cushioning. To break in new shoes, wear them for just a few hours a day. This prevents you from injuring your feet. Always look in your shoes before you put them on to be sure there are no objects inside.  Do not cross your legs. This may decrease the blood flow to your feet.  If you find a minor scrape,  cut, or break in the skin on your feet, keep it and the skin around it clean and dry. These areas may be cleansed with mild soap and water. Do not cleanse the area with peroxide, alcohol, or iodine.  When you remove an adhesive bandage, be sure not to damage the skin around it.  If you have a wound, look at it several times a day to make sure it is healing.  Do not use heating pads or hot water bottles. They may burn your skin. If you have lost feeling in your feet or legs, you may not know it is happening until it is too late.  Make sure your health care provider performs a complete foot exam at least annually or more often if you have foot problems. Report any cuts, sores, or bruises to your health care provider immediately. SEEK MEDICAL CARE IF:   You have an injury that is not healing.  You have cuts or breaks in the skin.  You have an ingrown nail.  You notice redness on your legs or feet.  You feel burning or tingling in your legs or feet.  You have pain or cramps in your legs and feet.  Your legs or feet are numb.  Your feet always feel cold. SEEK IMMEDIATE MEDICAL CARE IF:   There is increasing redness,   swelling, or pain in or around a wound.  There is a red line that goes up your leg.  Pus is coming from a wound.  You develop a fever or as directed by your health care provider.  You notice a bad smell coming from an ulcer or wound.   This information is not intended to replace advice given to you by your health care provider. Make sure you discuss any questions you have with your health care provider.   Document Released: 12/01/2000 Document Revised: 08/06/2013 Document Reviewed: 05/13/2013 Elsevier Interactive Patient Education 2016 [employer]  

## 2015-12-03 ENCOUNTER — Ambulatory Visit (INDEPENDENT_AMBULATORY_CARE_PROVIDER_SITE_OTHER): Payer: Medicare Other | Admitting: Endocrinology

## 2015-12-03 ENCOUNTER — Encounter: Payer: Self-pay | Admitting: Endocrinology

## 2015-12-03 VITALS — BP 130/84 | HR 101 | Temp 98.2°F | Ht 61.0 in | Wt 238.0 lb

## 2015-12-03 DIAGNOSIS — E109 Type 1 diabetes mellitus without complications: Secondary | ICD-10-CM | POA: Diagnosis not present

## 2015-12-03 LAB — POCT GLYCOSYLATED HEMOGLOBIN (HGB A1C): HEMOGLOBIN A1C: 8.2

## 2015-12-03 MED ORDER — INSULIN DETEMIR 100 UNIT/ML FLEXPEN: 290.0000 [IU] | PEN_INJECTOR | SUBCUTANEOUS | Status: DC

## 2015-12-03 NOTE — Patient Instructions (Addendum)
check your blood sugar twice a day.  vary the time of day when you check, between before the 3 meals, and at bedtime.  also check if you have symptoms of your blood sugar being too high or too low.  please keep a record of the readings and bring it to your next appointment here.  please call us sooner if your blood sugar goes below 70, or if you have a lot of readings over 200.  On this type of insulin schedule, you should eat meals on a regular schedule, especially lunch.  If a meal is missed or significantly delayed, your blood sugar could go low.  Also, you should carry a non-perishable snack, just in case.   Please come back for a follow-up appointment in 3-4 months.  Please bring your meter.  Please increase the insulin to 290 units each morning.

## 2015-12-03 NOTE — Progress Notes (Signed)
Subjective:    Patient ID: Andrew [name], male    DOB: 12/08/1956, 59 y.o.   MRN: [id number]  HPI Pt returns for f/u of diabetes mellitus: DM type: Insulin-requiring type 2 Dx'ed: 1991, when he presented with severe hyperglycemia (glucose was 1300).   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 DKA: never Severe hypoglycemia: never. Pancreatitis: never.   Other: he has a talking cbg meter; he was changed to a simple qd insulin regimen, due to poor results with multiple daily injections.   Interval history: He takes 270 units qam.  Pt says he never misses the insulin.  no cbg record, but states cbg's are well-controlled.   There is no trend throughout the day.   Past Medical History  Diagnosis Date  . Blindness   . HTN (hypertension)   . Diabetes mellitus (HCC)   . Hyperlipidemia   . Tachycardia     resting; on low dose B-Blocker  . OSA (obstructive sleep apnea)     AHI 56.3/hr, loud snoring, desaturation to 73% on RA (Dr. Jetty Duhamellinton Young)  . GERD (gastroesophageal reflux disease)     Past Surgical History  Procedure Laterality Date  . Cholecystectomy    . Transthoracic echocardiogram  08/25/2010    normal; EF=>55%; mild-mod conc LVH; mild MR    Social History   Social History  . Marital Status: Married    Spouse Name: N/A  . Number of Children: 2  . Years of Education: N/A   Occupational History  . disabled    Social History Main Topics  . Smoking status: Never Smoker   . Smokeless tobacco: Never Used  . Alcohol Use: Yes     Comment: couples times a year  . Drug Use: No  . Sexual Activity: Not on file   Other Topics Concern  . Not on file   Social History Narrative    Current Outpatient Prescriptions on File Prior to Visit  Medication Sig Dispense Refill  . BD PEN NEEDLE NANO U/F 32G X 4 MM MISC USE AS DIRECTED 100 each 2  . esomeprazole (NEXIUM) 40 MG capsule Take 40 mg by mouth 2 (two) times  daily before a meal.     . famotidine (PEPCID) 20 MG tablet Take 20 mg by mouth at bedtime.    . fexofenadine (ALLEGRA) 180 MG tablet     . losartan (COZAAR) 100 MG tablet     . metoprolol succinate (TOPROL-XL) 25 MG 24 hr tablet Take 25 mg by mouth daily.     . pantoprazole (PROTONIX) 40 MG tablet     . SYMBICORT 160-4.5 MCG/ACT inhaler     . zolpidem (AMBIEN) 10 MG tablet Take 10 mg by mouth at bedtime as needed.    Marland Kitchen. PROAIR HFA 108 (90 BASE) MCG/ACT inhaler Reported on 12/03/2015     No current facility-administered medications on file prior to visit.    Allergies  Allergen Reactions  . Lisinopril     cough    Family History  Problem Relation Age of Onset  . Cancer Paternal Grandmother   . Diabetes Father   . Diabetes Brother   . Diabetes Maternal Grandmother     BP 130/84 mmHg  Pulse 101  Temp(Src) 98.2 F (36.8 C) (Oral)  Ht 5\' 1"  (1.549 m)  Wt 238 lb (107.956 kg)  BMI 44.99 kg/m2  SpO2 94%  Review of Systems He denies hypoglycemia    Objective:   Physical  Exam VITAL SIGNS: See vs page.  GENERAL: no distress.   MSK: no deformity of the feet.   CV: 2+ bilat leg edema.   Skin: no ulcer on the feet. normal color and temp on the feet.  Neuro: sensation is intact to touch on the feet.  Ext: There is bilateral onychomycosis ofthe toenails.    Lab Results  Component Value Date   HGBA1C 8.2 12/03/2015       Assessment & Plan:  DM: he needs increased rx   Patient is advised the following: Patient Instructions  check your blood sugar twice a day.  vary the time of day when you check, between before the 3 meals, and at bedtime.  also check if you have symptoms of your blood sugar being too high or too low.  please keep a record of the readings and bring it to your next appointment here.  please call us sooner if your blood sugar goes below 70, or if you have a lot of readings over 200.  On this type of insulin schedule, you should eat meals on a regular  schedule, especially lunch.  If a meal is missed or significantly delayed, your blood sugar could go low.  Also, you should carry a non-perishable snack, just in case.   Please come back for a follow-up appointment in 3-4 months.  Please bring your meter.  Please increase the insulin to 290 units each morning.

## 2016-01-25 ENCOUNTER — Ambulatory Visit (HOSPITAL_COMMUNITY)
Admission: RE | Admit: 2016-01-25 | Discharge: 2016-01-25 | Disposition: A | Discharge status: Home / Self Care | Payer: Medicare Other | Source: Ambulatory Visit | Attending: Vascular Surgery | Admitting: Vascular Surgery

## 2016-01-25 ENCOUNTER — Other Ambulatory Visit (HOSPITAL_COMMUNITY): Payer: Self-pay | Admitting: Internal Medicine

## 2016-01-25 DIAGNOSIS — E119 Type 2 diabetes mellitus without complications: Secondary | ICD-10-CM | POA: Diagnosis not present

## 2016-01-25 DIAGNOSIS — M79606 Pain in leg, unspecified: Secondary | ICD-10-CM

## 2016-01-25 DIAGNOSIS — I10 Essential (primary) hypertension: Secondary | ICD-10-CM | POA: Diagnosis not present

## 2016-01-25 DIAGNOSIS — R600 Localized edema: Secondary | ICD-10-CM

## 2016-01-25 DIAGNOSIS — E785 Hyperlipidemia, unspecified: Secondary | ICD-10-CM | POA: Insufficient documentation

## 2016-01-26 ENCOUNTER — Ambulatory Visit (INDEPENDENT_AMBULATORY_CARE_PROVIDER_SITE_OTHER): Payer: Medicare Other | Admitting: Podiatry

## 2016-01-26 ENCOUNTER — Encounter: Payer: Self-pay | Admitting: Podiatry

## 2016-01-26 DIAGNOSIS — B351 Tinea unguium: Secondary | ICD-10-CM

## 2016-01-26 DIAGNOSIS — M79676 Pain in unspecified toe(s): Secondary | ICD-10-CM | POA: Diagnosis not present

## 2016-01-26 NOTE — Patient Instructions (Signed)
Diabetes and Foot Care Diabetes may cause you to have problems because of poor blood supply (circulation) to your feet and legs. This may cause the skin on your feet to become thinner, break easier, and heal more slowly. Your skin may become dry, and the skin may peel and crack. You may also have nerve damage in your legs and feet causing decreased feeling in them. You may not notice minor injuries to your feet that could lead to infections or more serious problems. Taking care of your feet is one of the most important things you can do for yourself.  HOME CARE INSTRUCTIONS  Wear shoes at all times, even in the house. Do not go barefoot. Bare feet are easily injured.  Check your feet daily for blisters, cuts, and redness. If you cannot see the bottom of your feet, use a mirror or ask someone for help.  Wash your feet with warm water (do not use hot water) and mild soap. Then pat your feet and the areas between your toes until they are completely dry. Do not soak your feet as this can dry your skin.  Apply a moisturizing lotion or petroleum jelly (that does not contain alcohol and is unscented) to the skin on your feet and to dry, brittle toenails. Do not apply lotion between your toes.  Trim your toenails straight across. Do not dig under them or around the cuticle. File the edges of your nails with an emery board or nail file.  Do not cut corns or calluses or try to remove them with medicine.  Wear clean socks or stockings every day. Make sure they are not too tight. Do not wear knee-high stockings since they may decrease blood flow to your legs.  Wear shoes that fit properly and have enough cushioning. To break in new shoes, wear them for just a few hours a day. This prevents you from injuring your feet. Always look in your shoes before you put them on to be sure there are no objects inside.  Do not cross your legs. This may decrease the blood flow to your feet.  If you find a minor scrape,  cut, or break in the skin on your feet, keep it and the skin around it clean and dry. These areas may be cleansed with mild soap and water. Do not cleanse the area with peroxide, alcohol, or iodine.  When you remove an adhesive bandage, be sure not to damage the skin around it.  If you have a wound, look at it several times a day to make sure it is healing.  Do not use heating pads or hot water bottles. They may burn your skin. If you have lost feeling in your feet or legs, you may not know it is happening until it is too late.  Make sure your health care provider performs a complete foot exam at least annually or more often if you have foot problems. Report any cuts, sores, or bruises to your health care provider immediately. SEEK MEDICAL CARE IF:   You have an injury that is not healing.  You have cuts or breaks in the skin.  You have an ingrown nail.  You notice redness on your legs or feet.  You feel burning or tingling in your legs or feet.  You have pain or cramps in your legs and feet.  Your legs or feet are numb.  Your feet always feel cold. SEEK IMMEDIATE MEDICAL CARE IF:   There is increasing redness,   swelling, or pain in or around a wound.  There is a red line that goes up your leg.  Pus is coming from a wound.  You develop a fever or as directed by your health care provider.  You notice a bad smell coming from an ulcer or wound.   This information is not intended to replace advice given to you by your health care provider. Make sure you discuss any questions you have with your health care provider.   Document Released: 12/01/2000 Document Revised: 08/06/2013 Document Reviewed: 05/13/2013 Elsevier Interactive Patient Education 2016 [employer]  

## 2016-01-27 NOTE — Progress Notes (Signed)
Patient ID: Andrew [name], male   DOB: 01/13/1956, 59 y.o.   MRN: [id number]  Subjective: This patient presents for a scheduled visit complaining of painful toenails and requesting nail debridement  Objective: Patient is blind No open skin lesions bilaterally The toenails are hypertrophic, discolored, deformed and tender to direct palpation 6-10  Assessment: Symptomatic onychomycoses 6-10 Type I diabetic  Plan: Patient will not allow the use of electrical bur Toenails 10 were debrided without any bleeding  Reappoint 3 months   

## 2016-04-07 ENCOUNTER — Encounter: Payer: Self-pay | Admitting: Endocrinology

## 2016-04-07 ENCOUNTER — Ambulatory Visit (INDEPENDENT_AMBULATORY_CARE_PROVIDER_SITE_OTHER): Payer: Medicare Other | Admitting: Endocrinology

## 2016-04-07 VITALS — BP 144/94 | HR 87 | Temp 98.0°F | Wt 243.0 lb

## 2016-04-07 DIAGNOSIS — E109 Type 1 diabetes mellitus without complications: Secondary | ICD-10-CM | POA: Diagnosis not present

## 2016-04-07 LAB — POCT GLYCOSYLATED HEMOGLOBIN (HGB A1C): Hemoglobin A1C: 7.9

## 2016-04-07 MED ORDER — INSULIN DETEMIR 100 UNIT/ML FLEXPEN
300.0000 [IU] | PEN_INJECTOR | SUBCUTANEOUS | Status: DC
Start: 2016-04-07 — End: 2016-08-07

## 2016-04-07 NOTE — Progress Notes (Signed)
Subjective:    Patient ID: Andrew [name], male    DOB: Sep 20, 1956, 60 y.o.   MRN: [id number]  HPI Pt returns for f/u of diabetes mellitus: DM type: Insulin-requiring type 2 Dx'ed: 1991, when he presented with severe hyperglycemia (glucose was 1300).   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  Therapy: insulin since 2008. DKA: never Severe hypoglycemia: never.  Pancreatitis: never.   Other: he has an audio cbg meter; he was changed to a simple qd insulin regimen, due to poor results with multiple daily injections.   Interval history: He takes 290 units qam.  Pt says he never misses the insulin.  no cbg record, but states cbg's vary from 70-200's. There is no trend throughout the day.   Past Medical History  Diagnosis Date  . Blindness   . HTN (hypertension)   . Diabetes mellitus (HCC)   . Hyperlipidemia   . Tachycardia     resting; on low dose B-Blocker  . OSA (obstructive sleep apnea)     AHI 56.3/hr, loud snoring, desaturation to 73% on RA (Dr. Jetty Duhamellinton [name])  . GERD (gastroesophageal reflux disease)     Past Surgical History  Procedure Laterality Date  . Cholecystectomy    . Transthoracic echocardiogram  08/25/2010    normal; EF=>55%; mild-mod conc LVH; mild MR    Social History   Social History  . Marital Status: Married    Spouse Name: N/A  . Number of Children: 2  . Years of Education: N/A   Occupational History  . disabled    Social History Main Topics  . Smoking status: Never Smoker   . Smokeless tobacco: Never Used  . Alcohol Use: Yes     Comment: couples times a year  . Drug Use: No  . Sexual Activity: Not on file   Other Topics Concern  . Not on file   Social History Narrative    Current Outpatient Prescriptions on File Prior to Visit  Medication Sig Dispense Refill  . BD PEN NEEDLE NANO U/F 32G X 4 MM MISC USE AS DIRECTED 100 each 2  . esomeprazole (NEXIUM) 40 MG capsule Take 40 mg by mouth 2 (two) times daily  before a meal.     . famotidine (PEPCID) 20 MG tablet Take 20 mg by mouth at bedtime.    . fexofenadine (ALLEGRA) 180 MG tablet     . gabapentin (NEURONTIN) 300 MG capsule Take 300 mg by mouth 3 (three) times daily.    Marland Kitchen. losartan (COZAAR) 100 MG tablet     . metoprolol succinate (TOPROL-XL) 25 MG 24 hr tablet Take 25 mg by mouth daily.     . pantoprazole (PROTONIX) 40 MG tablet     . PROAIR HFA 108 (90 BASE) MCG/ACT inhaler Reported on 12/03/2015    . SYMBICORT 160-4.5 MCG/ACT inhaler     . zolpidem (AMBIEN) 10 MG tablet Take 10 mg by mouth at bedtime as needed.     No current facility-administered medications on file prior to visit.    Allergies  Allergen Reactions  . Lisinopril     cough    Family History  Problem Relation Age of Onset  . Cancer Paternal Grandmother   . Diabetes Father   . Diabetes Brother   . Diabetes Maternal Grandmother     BP 144/94 mmHg  Pulse 87  Temp(Src) 98 F (36.7 C) (Oral)  Wt 243 lb (110.224 kg)  SpO2 95%  Review of Systems He  denies hypoglycemia.     Objective:   Physical Exam VITAL SIGNS:  See vs page GENERAL: no distress SKIN:  Insulin injection sites at the anterior thighs are normal.      A1c=7.9%    Assessment & Plan:  DM: he needs increased rx.  HTN: he may need increased rx  Patient is advised the following: Patient Instructions  check your blood sugar twice a day.  vary the time of day when you check, between before the 3 meals, and at bedtime.  also check if you have symptoms of your blood sugar being too high or too low.  please keep a record of the readings and bring it to your next appointment here.  please call us sooner if your blood sugar goes below 70, or if you have a lot of readings over 200.  On this type of insulin schedule, you should eat meals on a regular schedule, especially lunch.  If a meal is missed or significantly delayed, your blood sugar could go low.  Also, you should carry a non-perishable snack,  just in case.   Please come back for a follow-up appointment in 4 months.  Please bring your meter.   Please increase the insulin to 300 units each morning.   Please see Dr Concepcion Elk soon for your blood pressure.

## 2016-04-07 NOTE — Patient Instructions (Addendum)
check your blood sugar twice a day.  vary the time of day when you check, between before the 3 meals, and at bedtime.  also check if you have symptoms of your blood sugar being too high or too low.  please keep a record of the readings and bring it to your next appointment here.  please call us sooner if your blood sugar goes below 70, or if you have a lot of readings over 200.  On this type of insulin schedule, you should eat meals on a regular schedule, especially lunch.  If a meal is missed or significantly delayed, your blood sugar could go low.  Also, you should carry a non-perishable snack, just in case.   Please come back for a follow-up appointment in 4 months.  Please bring your meter.   Please increase the insulin to 300 units each morning.   Please see Dr Concepcion ElkAvbuere soon for your blood pressure.

## 2016-04-26 ENCOUNTER — Ambulatory Visit (INDEPENDENT_AMBULATORY_CARE_PROVIDER_SITE_OTHER): Payer: Medicare Other | Admitting: Podiatry

## 2016-04-26 ENCOUNTER — Encounter: Payer: Self-pay | Admitting: Podiatry

## 2016-04-26 DIAGNOSIS — M79676 Pain in unspecified toe(s): Secondary | ICD-10-CM

## 2016-04-26 DIAGNOSIS — B351 Tinea unguium: Secondary | ICD-10-CM | POA: Diagnosis not present

## 2016-04-26 NOTE — Patient Instructions (Signed)
Diabetes and Foot Care Diabetes may cause you to have problems because of poor blood supply (circulation) to your feet and legs. This may cause the skin on your feet to become thinner, break easier, and heal more slowly. Your skin may become dry, and the skin may peel and crack. You may also have nerve damage in your legs and feet causing decreased feeling in them. You may not notice minor injuries to your feet that could lead to infections or more serious problems. Taking care of your feet is one of the most important things you can do for yourself.  HOME CARE INSTRUCTIONS  Wear shoes at all times, even in the house. Do not go barefoot. Bare feet are easily injured.  Check your feet daily for blisters, cuts, and redness. If you cannot see the bottom of your feet, use a mirror or ask someone for help.  Wash your feet with warm water (do not use hot water) and mild soap. Then pat your feet and the areas between your toes until they are completely dry. Do not soak your feet as this can dry your skin.  Apply a moisturizing lotion or petroleum jelly (that does not contain alcohol and is unscented) to the skin on your feet and to dry, brittle toenails. Do not apply lotion between your toes.  Trim your toenails straight across. Do not dig under them or around the cuticle. File the edges of your nails with an emery board or nail file.  Do not cut corns or calluses or try to remove them with medicine.  Wear clean socks or stockings every day. Make sure they are not too tight. Do not wear knee-high stockings since they may decrease blood flow to your legs.  Wear shoes that fit properly and have enough cushioning. To break in new shoes, wear them for just a few hours a day. This prevents you from injuring your feet. Always look in your shoes before you put them on to be sure there are no objects inside.  Do not cross your legs. This may decrease the blood flow to your feet.  If you find a minor scrape,  cut, or break in the skin on your feet, keep it and the skin around it clean and dry. These areas may be cleansed with mild soap and water. Do not cleanse the area with peroxide, alcohol, or iodine.  When you remove an adhesive bandage, be sure not to damage the skin around it.  If you have a wound, look at it several times a day to make sure it is healing.  Do not use heating pads or hot water bottles. They may burn your skin. If you have lost feeling in your feet or legs, you may not know it is happening until it is too late.  Make sure your health care provider performs a complete foot exam at least annually or more often if you have foot problems. Report any cuts, sores, or bruises to your health care provider immediately. SEEK MEDICAL CARE IF:   You have an injury that is not healing.  You have cuts or breaks in the skin.  You have an ingrown nail.  You notice redness on your legs or feet.  You feel burning or tingling in your legs or feet.  You have pain or cramps in your legs and feet.  Your legs or feet are numb.  Your feet always feel cold. SEEK IMMEDIATE MEDICAL CARE IF:   There is increasing redness,   swelling, or pain in or around a wound.  There is a red line that goes up your leg.  Pus is coming from a wound.  You develop a fever or as directed by your health care provider.  You notice a bad smell coming from an ulcer or wound.   This information is not intended to replace advice given to you by your health care provider. Make sure you discuss any questions you have with your health care provider.   Document Released: 12/01/2000 Document Revised: 08/06/2013 Document Reviewed: 05/13/2013 Elsevier Interactive Patient Education 2016 [employer]  

## 2016-04-27 NOTE — Progress Notes (Signed)
Patient ID: Andrew NeedsJohnny R [name], male   DOB: 13-Nov-1956, 60 y.o.   MRN: [id number]   Subjective: This patient presents for a scheduled visit complaining of painful toenails and requesting nail debridement  Objective: Patient is blind No open skin lesions bilaterally The toenails are hypertrophic, discolored, deformed and tender to direct palpation 6-10  Assessment: Symptomatic onychomycoses 6-10 Type I diabetic  Plan: Patient will not allow the use of electrical bur Toenails 10 were debrided without any bleeding  Reappoint 3 months

## 2016-06-09 ENCOUNTER — Ambulatory Visit
Admission: RE | Admit: 2016-06-09 | Discharge: 2016-06-09 | Disposition: A | Discharge status: Home / Self Care | Payer: Medicare Other | Source: Ambulatory Visit | Attending: Internal Medicine | Admitting: Internal Medicine

## 2016-06-09 ENCOUNTER — Other Ambulatory Visit: Payer: Self-pay | Admitting: Internal Medicine

## 2016-06-09 DIAGNOSIS — J449 Chronic obstructive pulmonary disease, unspecified: Secondary | ICD-10-CM

## 2016-08-02 ENCOUNTER — Ambulatory Visit (INDEPENDENT_AMBULATORY_CARE_PROVIDER_SITE_OTHER): Payer: Medicare Other | Admitting: Podiatry

## 2016-08-02 ENCOUNTER — Encounter: Payer: Self-pay | Admitting: Podiatry

## 2016-08-02 DIAGNOSIS — B351 Tinea unguium: Secondary | ICD-10-CM

## 2016-08-02 DIAGNOSIS — M79676 Pain in unspecified toe(s): Secondary | ICD-10-CM

## 2016-08-02 NOTE — Patient Instructions (Signed)
Diabetes and Foot Care Diabetes may cause you to have problems because of poor blood supply (circulation) to your feet and legs. This may cause the skin on your feet to become thinner, break easier, and heal more slowly. Your skin may become dry, and the skin may peel and crack. You may also have nerve damage in your legs and feet causing decreased feeling in them. You may not notice minor injuries to your feet that could lead to infections or more serious problems. Taking care of your feet is one of the most important things you can do for yourself.  HOME CARE INSTRUCTIONS  Wear shoes at all times, even in the house. Do not go barefoot. Bare feet are easily injured.  Check your feet daily for blisters, cuts, and redness. If you cannot see the bottom of your feet, use a mirror or ask someone for help.  Wash your feet with warm water (do not use hot water) and mild soap. Then pat your feet and the areas between your toes until they are completely dry. Do not soak your feet as this can dry your skin.  Apply a moisturizing lotion or petroleum jelly (that does not contain alcohol and is unscented) to the skin on your feet and to dry, brittle toenails. Do not apply lotion between your toes.  Trim your toenails straight across. Do not dig under them or around the cuticle. File the edges of your nails with an emery board or nail file.  Do not cut corns or calluses or try to remove them with medicine.  Wear clean socks or stockings every day. Make sure they are not too tight. Do not wear knee-high stockings since they may decrease blood flow to your legs.  Wear shoes that fit properly and have enough cushioning. To break in new shoes, wear them for just a few hours a day. This prevents you from injuring your feet. Always look in your shoes before you put them on to be sure there are no objects inside.  Do not cross your legs. This may decrease the blood flow to your feet.  If you find a minor scrape,  cut, or break in the skin on your feet, keep it and the skin around it clean and dry. These areas may be cleansed with mild soap and water. Do not cleanse the area with peroxide, alcohol, or iodine.  When you remove an adhesive bandage, be sure not to damage the skin around it.  If you have a wound, look at it several times a day to make sure it is healing.  Do not use heating pads or hot water bottles. They may burn your skin. If you have lost feeling in your feet or legs, you may not know it is happening until it is too late.  Make sure your health care provider performs a complete foot exam at least annually or more often if you have foot problems. Report any cuts, sores, or bruises to your health care provider immediately. SEEK MEDICAL CARE IF:   You have an injury that is not healing.  You have cuts or breaks in the skin.  You have an ingrown nail.  You notice redness on your legs or feet.  You feel burning or tingling in your legs or feet.  You have pain or cramps in your legs and feet.  Your legs or feet are numb.  Your feet always feel cold. SEEK IMMEDIATE MEDICAL CARE IF:   There is increasing redness,   swelling, or pain in or around a wound.  There is a red line that goes up your leg.  Pus is coming from a wound.  You develop a fever or as directed by your health care provider.  You notice a bad smell coming from an ulcer or wound.   This information is not intended to replace advice given to you by your health care provider. Make sure you discuss any questions you have with your health care provider.   Document Released: 12/01/2000 Document Revised: 08/06/2013 Document Reviewed: 05/13/2013 Elsevier Interactive Patient Education 2016 [employer]  

## 2016-08-02 NOTE — Progress Notes (Signed)
Patient ID: Andrew [name], male   DOB: 07-Nov-1956, 60 y.o.   MRN: [id number]    Subjective: This patient presents for a scheduled visit complaining of painful toenails and requesting nail debridement  Objective: Patient is blind DP and PT pulses 2/4 bilaterally Capillary reflex immediate bilaterally No open skin lesions bilaterally The toenails are hypertrophic, discolored, deformed and tender to direct palpation 6-10  Assessment: Symptomatic onychomycoses 6-10 Type I diabetic  Plan: Patient will not allow the use of electrical bur Toenails 10 were debrided without any bleeding  Reappoint 3 months

## 2016-08-07 ENCOUNTER — Encounter: Payer: Self-pay | Admitting: Endocrinology

## 2016-08-07 ENCOUNTER — Ambulatory Visit (INDEPENDENT_AMBULATORY_CARE_PROVIDER_SITE_OTHER): Payer: Medicare Other | Admitting: Endocrinology

## 2016-08-07 VITALS — BP 132/82 | HR 78 | Ht 61.0 in | Wt 241.0 lb

## 2016-08-07 DIAGNOSIS — E109 Type 1 diabetes mellitus without complications: Secondary | ICD-10-CM

## 2016-08-07 LAB — POCT GLYCOSYLATED HEMOGLOBIN (HGB A1C): Hemoglobin A1C: 9.5

## 2016-08-07 MED ORDER — INSULIN GLARGINE 100 UNIT/ML SOLOSTAR PEN: 200.0000 [IU] | PEN_INJECTOR | SUBCUTANEOUS | 99 refills | Status: DC

## 2016-08-07 NOTE — Patient Instructions (Addendum)
check your blood sugar twice a day.  vary the time of day when you check, between before the 3 meals, and at bedtime.  also check if you have symptoms of your blood sugar being too high or too low.  please keep a record of the readings and bring it to your next appointment here.  please call Andrew [name] sooner if your blood sugar goes below 70, or if you have a lot of readings over 200.  On this type of insulin schedule, you should eat meals on a regular schedule, especially lunch.  If a meal is missed or significantly delayed, your blood sugar could go low.  Also, you should carry a non-perishable snack, just in case.   Please come back for a follow-up appointment in 2 months.  Please bring your meter.   Please change the levemir to lantus, 200 units each morning.

## 2016-08-07 NOTE — Progress Notes (Signed)
   Subjective:    Patient ID: Andrew [name] NeedsJohnny R Gallina, male    DOB: 09-11-1956, 60 y.o.   MRN: [id number]  HPI Pt returns for f/u of diabetes mellitus: DM type: Insulin-requiring type 2 Dx'ed: 1991, when he presented with severe hyperglycemia (glucose was 1300).   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  Therapy: insulin since 2008. DKA: never.   Severe hypoglycemia: never.  Pancreatitis: never.   Other: he has an audio cbg meter; he was changed to a qd insulin regimen, due to poor results with multiple daily injections.   Interval history: He takes 300 units qam.  Pt says he never misses the insulin.  no cbg record, but states cbg's vary from 70-200's. There is no trend throughout the day, except it is lowest in the afternoon.       Review of Systems He denies hypoglycemia    Objective:   Physical Exam VITAL SIGNS: See vs page.  GENERAL: no distress.   MSK: no deformity of the feet.   CV: 1+ bilat leg edema.   Skin: no ulcer on the feet. normal color and temp on the feet.  Neuro: sensation is intact to touch on the feet.  Ext: There is bilateral onychomycosis ofthe toenails.     A1c=9.4%    Assessment & Plan:  Insulin-requiring type 2 DM: worse

## 2016-09-08 ENCOUNTER — Telehealth: Payer: Self-pay | Admitting: Endocrinology

## 2016-09-08 NOTE — Telephone Encounter (Signed)
Pt is calling to discuss insulin pen adjustment

## 2016-09-08 NOTE — Telephone Encounter (Signed)
Requested a call back from the patient to discuss.  

## 2016-10-09 ENCOUNTER — Encounter: Payer: Self-pay | Admitting: Endocrinology

## 2016-10-09 ENCOUNTER — Ambulatory Visit (INDEPENDENT_AMBULATORY_CARE_PROVIDER_SITE_OTHER): Payer: Medicare Other | Admitting: Endocrinology

## 2016-10-09 VITALS — BP 150/100 | HR 97 | Ht 67.0 in | Wt 254.0 lb

## 2016-10-09 DIAGNOSIS — E109 Type 1 diabetes mellitus without complications: Secondary | ICD-10-CM

## 2016-10-09 LAB — POCT GLYCOSYLATED HEMOGLOBIN (HGB A1C): HEMOGLOBIN A1C: 7.9

## 2016-10-09 NOTE — Progress Notes (Signed)
Subjective:    Patient ID: Andrew [name], male    DOB: 02/18/1956, 60 y.o.   MRN: [id number]  HPI Pt returns for f/u of diabetes mellitus: DM type: Insulin-requiring type 2 Dx'ed: 1991, when he presented with severe hyperglycemia (glucose was 1300).   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  Therapy: insulin since 2008. DKA: never.   Severe hypoglycemia: never.  Pancreatitis: never.   Other: he has an audio cbg meter; he was changed to a qd insulin regimen, due to poor results with multiple daily injections.   Interval history:  Pt says he never misses the insulin.  no cbg record, but states cbg's vary from 60-200's. There is no trend throughout the day, except it is lowest in the afternoon.  pt states he feels well in general. Past Medical History:  Diagnosis Date  . Blindness   . Diabetes mellitus (HCC)   . GERD (gastroesophageal reflux disease)   . HTN (hypertension)   . Hyperlipidemia   . OSA (obstructive sleep apnea)    AHI 56.3/hr, loud snoring, desaturation to 73% on RA (Dr. Jetty Duhamellinton Young)  . Tachycardia    resting; on low dose B-Blocker    Past Surgical History:  Procedure Laterality Date  . CHOLECYSTECTOMY    . TRANSTHORACIC ECHOCARDIOGRAM  08/25/2010   normal; EF=>55%; mild-mod conc LVH; mild MR    Social History   Social History  . Marital status: Married    Spouse name: N/A  . Number of children: 2  . Years of education: N/A   Occupational History  . disabled    Social History Main Topics  . Smoking status: Never Smoker  . Smokeless tobacco: Never Used  . Alcohol use Yes     Comment: couples times a year  . Drug use: No  . Sexual activity: Not on file   Other Topics Concern  . Not on file   Social History Narrative  . No narrative on file    Current Outpatient Prescriptions on File Prior to Visit  Medication Sig Dispense Refill  . BD PEN NEEDLE NANO U/F 32G X 4 MM MISC USE AS DIRECTED 100 each 2  .  esomeprazole (NEXIUM) 40 MG capsule Take 40 mg by mouth 2 (two) times daily before a meal.     . famotidine (PEPCID) 20 MG tablet Take 20 mg by mouth at bedtime.    . fexofenadine (ALLEGRA) 180 MG tablet     . gabapentin (NEURONTIN) 300 MG capsule Take 300 mg by mouth 3 (three) times daily.    . Insulin Glargine (LANTUS SOLOSTAR) 100 UNIT/ML Solostar Pen Inject 200 Units into the skin every morning. And pen needles 3/day 25 pen PRN  . losartan (COZAAR) 100 MG tablet     . metoprolol succinate (TOPROL-XL) 25 MG 24 hr tablet Take 25 mg by mouth daily.     . pantoprazole (PROTONIX) 40 MG tablet     . PROAIR HFA 108 (90 BASE) MCG/ACT inhaler Reported on 12/03/2015    . SYMBICORT 160-4.5 MCG/ACT inhaler     . zolpidem (AMBIEN) 10 MG tablet Take 10 mg by mouth at bedtime as needed.     No current facility-administered medications on file prior to visit.     Allergies  Allergen Reactions  . Lisinopril     cough    Family History  Problem Relation Age of Onset  . Cancer Paternal Grandmother   . Diabetes Father   . Diabetes Brother   .  Diabetes Maternal Grandmother    BP (!) 150/100   Pulse 97   Ht 5\' 7"  (1.702 m)   Wt 254 lb (115.2 kg)   SpO2 92%   BMI 39.78 kg/m   Review of Systems Denies LOC.     Objective:   Physical Exam VITAL SIGNS: See vs page.  GENERAL: no distress.   MSK: no deformity of the feet.   CV: 1+ bilat leg edema.   Skin: no ulcer on the feet. normal color and temp on the feet.  Neuro: sensation is intact to touch on the feet.  Ext: There is bilateral onychomycosis ofthe toenails.   Lab Results  Component Value Date   HGBA1C 7.9 10/09/2016     Assessment & Plan:  Insulin-requiring type 2 DM: this is the best control this pt should aim for, given this regimen, which does match insulin to her changing needs throughout the day. Please continue the same insulin.  Please come back for a follow-up appointment in 3 months.

## 2016-10-09 NOTE — Patient Instructions (Addendum)
check your blood sugar twice a day.  vary the time of day when you check, between before the 3 meals, and at bedtime.  also check if you have symptoms of your blood sugar being too high or too low.  please keep a record of the readings and bring it to your next appointment here.  please call us sooner if your blood sugar goes below 70, or if you have a lot of readings over 200.  On this type of insulin schedule, you should eat meals on a regular schedule, especially lunch.  If a meal is missed or significantly delayed, your blood sugar could go low.  Also, you should carry a non-perishable snack, just in case.   Please come back for a follow-up appointment in 3 months.  Please bring your meter.   Please change the levemir to lantus, 200 units each morning.

## 2016-11-01 ENCOUNTER — Ambulatory Visit (INDEPENDENT_AMBULATORY_CARE_PROVIDER_SITE_OTHER): Payer: Medicare Other | Admitting: Podiatry

## 2016-11-01 ENCOUNTER — Encounter: Payer: Self-pay | Admitting: Podiatry

## 2016-11-01 VITALS — BP 173/97 | HR 100 | Resp 18

## 2016-11-01 DIAGNOSIS — B351 Tinea unguium: Secondary | ICD-10-CM | POA: Diagnosis not present

## 2016-11-01 DIAGNOSIS — M79676 Pain in unspecified toe(s): Secondary | ICD-10-CM | POA: Diagnosis not present

## 2016-11-01 NOTE — Progress Notes (Signed)
Patient ID: Andrew [name], male   DOB: January 23, 1956, 60 y.o.   MRN: [id number]    Subjective: This patient presents for a scheduled visit complaining of painful toenails and requesting nail debridement  Objective: Patient is blind Orientated 3 Sensation to 10 g monofilament wire intact 5/5 bilaterally Vibratory sensation reactive bilaterally Ankle reflexes equal and reactive bilaterally Pes planus bilaterally Stable gait DP and PT pulses 2/4 bilaterally Capillary reflex immediate bilaterally No open skin lesions bilaterally The toenails are hypertrophic, discolored, deformed and tender to direct palpation 6-10  Assessment: Symptomatic onychomycoses 6-10 Type I diabetic with satisfactory neurovascular status  Plan: Patient will not allow the use of electrical bur Toenails 10 were debrided without any bleeding  Reappoint 3 months

## 2016-11-01 NOTE — Patient Instructions (Signed)

## 2016-11-08 ENCOUNTER — Inpatient Hospital Stay (HOSPITAL_COMMUNITY)
Admission: EM | Admit: 2016-11-08 | Discharge: 2016-11-24 | DRG: 004 | Disposition: A | Discharge status: Other Acute Inpatient Hospital | Payer: Medicare Other | Attending: Internal Medicine | Admitting: Internal Medicine

## 2016-11-08 ENCOUNTER — Emergency Department (HOSPITAL_COMMUNITY): Payer: Medicare Other

## 2016-11-08 ENCOUNTER — Inpatient Hospital Stay (HOSPITAL_COMMUNITY): Payer: Medicare Other

## 2016-11-08 DIAGNOSIS — Y848 Other medical procedures as the cause of abnormal reaction of the patient, or of later complication, without mention of misadventure at the time of the procedure: Secondary | ICD-10-CM | POA: Diagnosis not present

## 2016-11-08 DIAGNOSIS — G9340 Encephalopathy, unspecified: Secondary | ICD-10-CM | POA: Diagnosis not present

## 2016-11-08 DIAGNOSIS — R509 Fever, unspecified: Secondary | ICD-10-CM | POA: Diagnosis not present

## 2016-11-08 DIAGNOSIS — M6282 Rhabdomyolysis: Secondary | ICD-10-CM | POA: Diagnosis present

## 2016-11-08 DIAGNOSIS — R739 Hyperglycemia, unspecified: Secondary | ICD-10-CM | POA: Diagnosis not present

## 2016-11-08 DIAGNOSIS — Z930 Tracheostomy status: Secondary | ICD-10-CM

## 2016-11-08 DIAGNOSIS — G931 Anoxic brain damage, not elsewhere classified: Secondary | ICD-10-CM | POA: Diagnosis not present

## 2016-11-08 DIAGNOSIS — J690 Pneumonitis due to inhalation of food and vomit: Secondary | ICD-10-CM

## 2016-11-08 DIAGNOSIS — E11649 Type 2 diabetes mellitus with hypoglycemia without coma: Secondary | ICD-10-CM | POA: Diagnosis present

## 2016-11-08 DIAGNOSIS — I248 Other forms of acute ischemic heart disease: Secondary | ICD-10-CM | POA: Diagnosis present

## 2016-11-08 DIAGNOSIS — Z4659 Encounter for fitting and adjustment of other gastrointestinal appliance and device: Secondary | ICD-10-CM | POA: Diagnosis not present

## 2016-11-08 DIAGNOSIS — J9601 Acute respiratory failure with hypoxia: Secondary | ICD-10-CM | POA: Diagnosis not present

## 2016-11-08 DIAGNOSIS — R1310 Dysphagia, unspecified: Secondary | ICD-10-CM

## 2016-11-08 DIAGNOSIS — E878 Other disorders of electrolyte and fluid balance, not elsewhere classified: Secondary | ICD-10-CM | POA: Diagnosis not present

## 2016-11-08 DIAGNOSIS — H547 Unspecified visual loss: Secondary | ICD-10-CM | POA: Diagnosis present

## 2016-11-08 DIAGNOSIS — E785 Hyperlipidemia, unspecified: Secondary | ICD-10-CM | POA: Diagnosis present

## 2016-11-08 DIAGNOSIS — E872 Acidosis: Secondary | ICD-10-CM | POA: Diagnosis present

## 2016-11-08 DIAGNOSIS — Z794 Long term (current) use of insulin: Secondary | ICD-10-CM | POA: Diagnosis not present

## 2016-11-08 DIAGNOSIS — Z9911 Dependence on respirator [ventilator] status: Secondary | ICD-10-CM

## 2016-11-08 DIAGNOSIS — E870 Hyperosmolality and hypernatremia: Secondary | ICD-10-CM | POA: Diagnosis not present

## 2016-11-08 DIAGNOSIS — Z6829 Body mass index (BMI) 29.0-29.9, adult: Secondary | ICD-10-CM

## 2016-11-08 DIAGNOSIS — J9600 Acute respiratory failure, unspecified whether with hypoxia or hypercapnia: Secondary | ICD-10-CM

## 2016-11-08 DIAGNOSIS — E162 Hypoglycemia, unspecified: Secondary | ICD-10-CM

## 2016-11-08 DIAGNOSIS — B961 Klebsiella pneumoniae [K. pneumoniae] as the cause of diseases classified elsewhere: Secondary | ICD-10-CM | POA: Diagnosis present

## 2016-11-08 DIAGNOSIS — E1165 Type 2 diabetes mellitus with hyperglycemia: Secondary | ICD-10-CM | POA: Diagnosis present

## 2016-11-08 DIAGNOSIS — D696 Thrombocytopenia, unspecified: Secondary | ICD-10-CM | POA: Diagnosis not present

## 2016-11-08 DIAGNOSIS — D649 Anemia, unspecified: Secondary | ICD-10-CM | POA: Diagnosis not present

## 2016-11-08 DIAGNOSIS — G4733 Obstructive sleep apnea (adult) (pediatric): Secondary | ICD-10-CM | POA: Diagnosis present

## 2016-11-08 DIAGNOSIS — K219 Gastro-esophageal reflux disease without esophagitis: Secondary | ICD-10-CM | POA: Diagnosis present

## 2016-11-08 DIAGNOSIS — Z430 Encounter for attention to tracheostomy: Secondary | ICD-10-CM

## 2016-11-08 DIAGNOSIS — E46 Unspecified protein-calorie malnutrition: Secondary | ICD-10-CM | POA: Diagnosis present

## 2016-11-08 DIAGNOSIS — J40 Bronchitis, not specified as acute or chronic: Secondary | ICD-10-CM | POA: Diagnosis present

## 2016-11-08 DIAGNOSIS — Z9289 Personal history of other medical treatment: Secondary | ICD-10-CM

## 2016-11-08 DIAGNOSIS — E669 Obesity, unspecified: Secondary | ICD-10-CM | POA: Diagnosis present

## 2016-11-08 DIAGNOSIS — R0602 Shortness of breath: Secondary | ICD-10-CM

## 2016-11-08 DIAGNOSIS — R9401 Abnormal electroencephalogram [EEG]: Secondary | ICD-10-CM

## 2016-11-08 DIAGNOSIS — Z833 Family history of diabetes mellitus: Secondary | ICD-10-CM

## 2016-11-08 DIAGNOSIS — E876 Hypokalemia: Secondary | ICD-10-CM | POA: Diagnosis not present

## 2016-11-08 DIAGNOSIS — N179 Acute kidney failure, unspecified: Secondary | ICD-10-CM | POA: Diagnosis not present

## 2016-11-08 DIAGNOSIS — I10 Essential (primary) hypertension: Secondary | ICD-10-CM | POA: Diagnosis present

## 2016-11-08 DIAGNOSIS — J9503 Malfunction of tracheostomy stoma: Secondary | ICD-10-CM | POA: Diagnosis not present

## 2016-11-08 DIAGNOSIS — Z978 Presence of other specified devices: Secondary | ICD-10-CM

## 2016-11-08 DIAGNOSIS — J9690 Respiratory failure, unspecified, unspecified whether with hypoxia or hypercapnia: Secondary | ICD-10-CM

## 2016-11-08 LAB — I-STAT CG4 LACTIC ACID, ED
LACTIC ACID, VENOUS: 4.13 mmol/L — AB (ref 0.5–1.9)
Lactic Acid, Venous: 3.91 mmol/L (ref 0.5–1.9)

## 2016-11-08 LAB — COMPREHENSIVE METABOLIC PANEL
ALT: 92 U/L — AB (ref 17–63)
AST: 218 U/L — AB (ref 15–41)
Albumin: 3.4 g/dL — ABNORMAL LOW (ref 3.5–5.0)
Alkaline Phosphatase: 80 U/L (ref 38–126)
Anion gap: 11 (ref 5–15)
BILIRUBIN TOTAL: 1.1 mg/dL (ref 0.3–1.2)
BUN: 23 mg/dL — AB (ref 6–20)
CHLORIDE: 110 mmol/L (ref 101–111)
CO2: 23 mmol/L (ref 22–32)
CREATININE: 1.97 mg/dL — AB (ref 0.61–1.24)
Calcium: 8.7 mg/dL — ABNORMAL LOW (ref 8.9–10.3)
GFR calc Af Amer: 41 mL/min — ABNORMAL LOW (ref >60)
GFR, EST NON AFRICAN AMERICAN: 35 mL/min — AB (ref >60)
Glucose, Bld: 100 mg/dL — ABNORMAL HIGH (ref 65–99)
Potassium: 4.9 mmol/L (ref 3.5–5.1)
Sodium: 144 mmol/L (ref 135–145)
Total Protein: 8.1 g/dL (ref 6.5–8.1)

## 2016-11-08 LAB — CBC WITH DIFFERENTIAL/PLATELET
BASOS ABS: 0 10*3/uL (ref 0.0–0.1)
Basophils Relative: 0 %
Eosinophils Absolute: 0 10*3/uL (ref 0.0–0.7)
Eosinophils Relative: 0 %
HEMATOCRIT: 48.8 % (ref 39.0–52.0)
Hemoglobin: 16 g/dL (ref 13.0–17.0)
LYMPHS PCT: 5 %
Lymphs Abs: 1.2 10*3/uL (ref 0.7–4.0)
MCH: 29.5 pg (ref 26.0–34.0)
MCHC: 32.8 g/dL (ref 30.0–36.0)
MCV: 90 fL (ref 78.0–100.0)
Monocytes Absolute: 2.4 10*3/uL — ABNORMAL HIGH (ref 0.1–1.0)
Monocytes Relative: 11 %
NEUTROS ABS: 18.3 10*3/uL — AB (ref 1.7–7.7)
NEUTROS PCT: 84 %
PLATELETS: 156 10*3/uL (ref 150–400)
RBC: 5.42 MIL/uL (ref 4.22–5.81)
RDW: 15.6 % — ABNORMAL HIGH (ref 11.5–15.5)
WBC: 21.8 10*3/uL — AB (ref 4.0–10.5)

## 2016-11-08 LAB — BASIC METABOLIC PANEL
Anion gap: 11 (ref 5–15)
Anion gap: 10 (ref 5–15)
BUN: 23 mg/dL — AB (ref 6–20)
BUN: 24 mg/dL — AB (ref 6–20)
CALCIUM: 8.1 mg/dL — AB (ref 8.9–10.3)
CALCIUM: 7.7 mg/dL — AB (ref 8.9–10.3)
CHLORIDE: 113 mmol/L — AB (ref 101–111)
CHLORIDE: 114 mmol/L — AB (ref 101–111)
CO2: 22 mmol/L (ref 22–32)
CO2: 21 mmol/L — ABNORMAL LOW (ref 22–32)
CREATININE: 1.71 mg/dL — AB (ref 0.61–1.24)
CREATININE: 1.93 mg/dL — AB (ref 0.61–1.24)
GFR calc non Af Amer: 42 mL/min — ABNORMAL LOW (ref >60)
GFR calc non Af Amer: 36 mL/min — ABNORMAL LOW (ref >60)
GFR, EST AFRICAN AMERICAN: 49 mL/min — AB (ref >60)
GFR, EST AFRICAN AMERICAN: 42 mL/min — AB (ref >60)
Glucose, Bld: 53 mg/dL — ABNORMAL LOW (ref 65–99)
Glucose, Bld: 85 mg/dL (ref 65–99)
Potassium: 4.4 mmol/L (ref 3.5–5.1)
Potassium: 4.4 mmol/L (ref 3.5–5.1)
SODIUM: 146 mmol/L — AB (ref 135–145)
SODIUM: 145 mmol/L (ref 135–145)

## 2016-11-08 LAB — CBG MONITORING, ED
GLUCOSE-CAPILLARY: 60 mg/dL — AB (ref 65–99)
Glucose-Capillary: 78 mg/dL (ref 65–99)
Glucose-Capillary: 113 mg/dL — ABNORMAL HIGH (ref 65–99)

## 2016-11-08 LAB — GLUCOSE, CAPILLARY
GLUCOSE-CAPILLARY: 81 mg/dL (ref 65–99)
GLUCOSE-CAPILLARY: 53 mg/dL — AB (ref 65–99)
GLUCOSE-CAPILLARY: 96 mg/dL (ref 65–99)
GLUCOSE-CAPILLARY: 112 mg/dL — AB (ref 65–99)
Glucose-Capillary: 113 mg/dL — ABNORMAL HIGH (ref 65–99)
Glucose-Capillary: 68 mg/dL (ref 65–99)
Glucose-Capillary: 85 mg/dL (ref 65–99)

## 2016-11-08 LAB — URINALYSIS, ROUTINE W REFLEX MICROSCOPIC
GLUCOSE, UA: NEGATIVE mg/dL
KETONES UR: 15 mg/dL — AB
LEUKOCYTES UA: NEGATIVE
Nitrite: NEGATIVE
PH: 5 (ref 5.0–8.0)
Protein, ur: >300 mg/dL — AB
SPECIFIC GRAVITY, URINE: 1.026 (ref 1.005–1.030)

## 2016-11-08 LAB — I-STAT ARTERIAL BLOOD GAS, ED
ACID-BASE DEFICIT: 1 mmol/L (ref 0.0–2.0)
BICARBONATE: 23.6 mmol/L (ref 20.0–28.0)
O2 SAT: 100 %
TCO2: 25 mmol/L (ref 0–100)
pCO2 arterial: 37.2 mmHg (ref 32.0–48.0)
pH, Arterial: 7.404 (ref 7.350–7.450)
pO2, Arterial: 192 mmHg — ABNORMAL HIGH (ref 83.0–108.0)

## 2016-11-08 LAB — CBC
HCT: 45 % (ref 39.0–52.0)
Hemoglobin: 14.5 g/dL (ref 13.0–17.0)
MCH: 29.1 pg (ref 26.0–34.0)
MCHC: 32.2 g/dL (ref 30.0–36.0)
MCV: 90.4 fL (ref 78.0–100.0)
Platelets: 116 K/uL — ABNORMAL LOW (ref 150–400)
RBC: 4.98 MIL/uL (ref 4.22–5.81)
RDW: 15.9 % — ABNORMAL HIGH (ref 11.5–15.5)
WBC: 16.4 K/uL — ABNORMAL HIGH (ref 4.0–10.5)

## 2016-11-08 LAB — I-STAT CHEM 8, ED
BUN: 34 mg/dL — AB (ref 6–20)
CHLORIDE: 108 mmol/L (ref 101–111)
CREATININE: 2.1 mg/dL — AB (ref 0.61–1.24)
Calcium, Ion: 1.05 mmol/L — ABNORMAL LOW (ref 1.15–1.40)
Glucose, Bld: 95 mg/dL (ref 65–99)
HEMATOCRIT: 52 % (ref 39.0–52.0)
Hemoglobin: 17.7 g/dL — ABNORMAL HIGH (ref 13.0–17.0)
Potassium: 4.6 mmol/L (ref 3.5–5.1)
Sodium: 146 mmol/L — ABNORMAL HIGH (ref 135–145)
TCO2: 27 mmol/L (ref 0–100)

## 2016-11-08 LAB — RAPID URINE DRUG SCREEN, HOSP PERFORMED
Amphetamines: NOT DETECTED
BARBITURATES: NOT DETECTED
Benzodiazepines: NOT DETECTED
Cocaine: NOT DETECTED
Opiates: NOT DETECTED
TETRAHYDROCANNABINOL: NOT DETECTED

## 2016-11-08 LAB — CK: Total CK: 39731 U/L — ABNORMAL HIGH (ref 49–397)

## 2016-11-08 LAB — URINE MICROSCOPIC-ADD ON

## 2016-11-08 LAB — SALICYLATE LEVEL: Salicylate Lvl: <7 mg/dL (ref 2.8–30.0)

## 2016-11-08 LAB — I-STAT TROPONIN, ED
Troponin i, poc: 0.02 ng/mL (ref 0.00–0.08)
Troponin i, poc: 0.06 ng/mL (ref 0.00–0.08)
Troponin i, poc: 0.09 ng/mL (ref 0.00–0.08)

## 2016-11-08 LAB — CREATININE, SERUM
Creatinine, Ser: 1.71 mg/dL — ABNORMAL HIGH (ref 0.61–1.24)
GFR calc Af Amer: 49 mL/min — ABNORMAL LOW
GFR calc non Af Amer: 42 mL/min — ABNORMAL LOW

## 2016-11-08 LAB — PROCALCITONIN: Procalcitonin: 7.39 ng/mL

## 2016-11-08 LAB — LACTIC ACID, PLASMA
LACTIC ACID, VENOUS: 3.8 mmol/L — AB (ref 0.5–1.9)
Lactic Acid, Venous: 3.6 mmol/L (ref 0.5–1.9)

## 2016-11-08 LAB — MAGNESIUM: MAGNESIUM: 2 mg/dL (ref 1.7–2.4)

## 2016-11-08 LAB — PHOSPHORUS: PHOSPHORUS: 4.1 mg/dL (ref 2.5–4.6)

## 2016-11-08 LAB — MRSA PCR SCREENING: MRSA by PCR: NEGATIVE

## 2016-11-08 LAB — ACETAMINOPHEN LEVEL: Acetaminophen (Tylenol), Serum: <10 ug/mL — ABNORMAL LOW (ref 10–30)

## 2016-11-08 MED ORDER — HEPARIN SODIUM (PORCINE) 5000 UNIT/ML IJ SOLN
5000.0000 [IU] | Freq: Three times a day (TID) | INTRAMUSCULAR | Status: DC
  Administered 2016-11-08 – 2016-11-20 (×34): 5000 [IU] via SUBCUTANEOUS
  Filled 2016-11-08 (×36): qty 1

## 2016-11-08 MED ORDER — CHLORHEXIDINE GLUCONATE 0.12% ORAL RINSE (MEDLINE KIT)
15.0000 mL | Freq: Two times a day (BID) | OROMUCOSAL | Status: DC
  Administered 2016-11-08 – 2016-11-24 (×32): 15 mL via OROMUCOSAL

## 2016-11-08 MED ORDER — DEXTROSE 50 % IV SOLN
50.0000 mL | Freq: Once | INTRAVENOUS | Status: AC
  Administered 2016-11-08: 50 mL via INTRAVENOUS

## 2016-11-08 MED ORDER — SUCCINYLCHOLINE CHLORIDE 20 MG/ML IJ SOLN
INTRAMUSCULAR | Status: DC | PRN
  Administered 2016-11-08: 100 mg via INTRAVENOUS

## 2016-11-08 MED ORDER — PIPERACILLIN-TAZOBACTAM 3.375 G IVPB
3.3750 g | Freq: Three times a day (TID) | INTRAVENOUS | Status: DC
  Administered 2016-11-08 – 2016-11-13 (×15): 3.375 g via INTRAVENOUS
  Filled 2016-11-08 (×17): qty 50

## 2016-11-08 MED ORDER — VANCOMYCIN HCL 10 G IV SOLR: 1250.0000 mg | INTRAVENOUS | Status: DC

## 2016-11-08 MED ORDER — SODIUM CHLORIDE 0.9 % IV BOLUS (SEPSIS)
1000.0000 mL | Freq: Once | INTRAVENOUS | Status: AC
  Administered 2016-11-08: 1000 mL via INTRAVENOUS

## 2016-11-08 MED ORDER — FAMOTIDINE IN NACL 20-0.9 MG/50ML-% IV SOLN
20.0000 mg | Freq: Two times a day (BID) | INTRAVENOUS | Status: DC
  Administered 2016-11-08 – 2016-11-13 (×11): 20 mg via INTRAVENOUS
  Filled 2016-11-08 (×13): qty 50

## 2016-11-08 MED ORDER — DEXTROSE 50 % IV SOLN
INTRAVENOUS | Status: AC
  Filled 2016-11-08: qty 50

## 2016-11-08 MED ORDER — FENTANYL CITRATE (PF) 100 MCG/2ML IJ SOLN
100.0000 ug | INTRAMUSCULAR | Status: DC | PRN
  Administered 2016-11-10 (×2): 100 ug via INTRAVENOUS
  Filled 2016-11-08 (×7): qty 2

## 2016-11-08 MED ORDER — DEXTROSE 10 % IV SOLN
INTRAVENOUS | Status: DC
  Administered 2016-11-08: 18:00:00 via INTRAVENOUS
  Administered 2016-11-09: 100 mL/h via INTRAVENOUS
  Administered 2016-11-09: 14:00:00 via INTRAVENOUS

## 2016-11-08 MED ORDER — PROPOFOL 1000 MG/100ML IV EMUL
INTRAVENOUS | Status: AC
  Filled 2016-11-08: qty 100

## 2016-11-08 MED ORDER — PROPOFOL 1000 MG/100ML IV EMUL
0.0000 ug/kg/min | INTRAVENOUS | Status: DC
  Administered 2016-11-08 (×2): 25 ug/kg/min via INTRAVENOUS
  Administered 2016-11-09: 30 ug/kg/min via INTRAVENOUS
  Administered 2016-11-09: 19.965 ug/kg/min via INTRAVENOUS
  Administered 2016-11-09: 25 ug/kg/min via INTRAVENOUS
  Administered 2016-11-09: 20 ug/kg/min via INTRAVENOUS
  Administered 2016-11-10: 25 ug/kg/min via INTRAVENOUS
  Filled 2016-11-08 (×6): qty 100

## 2016-11-08 MED ORDER — ALBUTEROL SULFATE (2.5 MG/3ML) 0.083% IN NEBU
2.5000 mg | INHALATION_SOLUTION | RESPIRATORY_TRACT | Status: DC | PRN
  Administered 2016-11-16: 2.5 mg via RESPIRATORY_TRACT
  Filled 2016-11-08: qty 3

## 2016-11-08 MED ORDER — FENTANYL CITRATE (PF) 100 MCG/2ML IJ SOLN
100.0000 ug | INTRAMUSCULAR | Status: DC | PRN
  Administered 2016-11-11 – 2016-11-18 (×14): 100 ug via INTRAVENOUS
  Filled 2016-11-08 (×12): qty 2

## 2016-11-08 MED ORDER — PROPOFOL 500 MG/50ML IV EMUL
INTRAVENOUS | Status: DC | PRN
  Administered 2016-11-08: 10 ug/kg/min via INTRAVENOUS

## 2016-11-08 MED ORDER — ETOMIDATE 2 MG/ML IV SOLN
INTRAVENOUS | Status: DC | PRN
  Administered 2016-11-08: 20 mg via INTRAVENOUS

## 2016-11-08 MED ORDER — PIPERACILLIN-TAZOBACTAM 3.375 G IVPB 30 MIN
3.3750 g | Freq: Once | INTRAVENOUS | Status: AC
  Administered 2016-11-08: 3.375 g via INTRAVENOUS
  Filled 2016-11-08: qty 50

## 2016-11-08 MED ORDER — SODIUM CHLORIDE 0.9 % IV BOLUS (SEPSIS)
1000.0000 mL | Freq: Once | INTRAVENOUS | Status: DC
  Administered 2016-11-08: 1000 mL via INTRAVENOUS

## 2016-11-08 MED ORDER — ORAL CARE MOUTH RINSE
15.0000 mL | Freq: Four times a day (QID) | OROMUCOSAL | Status: DC
  Administered 2016-11-09 – 2016-11-24 (×62): 15 mL via OROMUCOSAL

## 2016-11-08 MED ORDER — VANCOMYCIN HCL 10 G IV SOLR
2500.0000 mg | Freq: Once | INTRAVENOUS | Status: DC
  Filled 2016-11-08: qty 2500

## 2016-11-08 MED ORDER — SODIUM CHLORIDE 0.9 % IV BOLUS (SEPSIS): 500.0000 mL | Freq: Once | INTRAVENOUS | Status: DC

## 2016-11-08 MED ORDER — DEXTROSE-NACL 5-0.9 % IV SOLN
INTRAVENOUS | Status: DC
  Administered 2016-11-08: 125 mL/h via INTRAVENOUS

## 2016-11-08 NOTE — Progress Notes (Signed)
   calll from ER-RN to e-MD  Repeated hypoglycemia  Plan  - prn d50  - start d10 (currently on d5)  Dr. Kalman ShanMurali Izzabell Klasen, M.D., [facility] Pulmonary and Critical Care Medicine Staff Physician [facility] Pager: [phone], If no answer or between  15:00h - 7:00h: call [phone]  11/08/2016 4:19 PM

## 2016-11-08 NOTE — ED Notes (Signed)
Pt's daughter is able to get information, but call wife first-- wife is in [place] for rehab for arm surgery.  Wife states pt has been getting depressed because he is alone and not taking care himself -- pt states that he is not eating or taking medicine,   Wife Tamera Reason-Jonita -[phone] Daughter --  Rodney Boozeasha -- [phone]

## 2016-11-08 NOTE — Progress Notes (Signed)
eLink Physician-Brief Progress Note Patient Name: Andrew [name] NeedsJohnny R Ferrufino DOB: Oct 13, 1956 MRN: [id number]   Date of Service  11/08/2016  HPI/Events of Note   Recent Labs Lab 11/08/16 1330 11/08/16 1337 11/08/16 1341 11/08/16 1656  LATICACIDVEN  --  3.6* 3.91* 3.8*  PROCALCITON 7.39  --   --   --      eICU Interventions  persisent high lactate - will repeat 23.00h     Intervention Category Intermediate Interventions: Diagnostic test evaluation  Alaylah Heatherington 11/08/2016, 6:11 PM

## 2016-11-08 NOTE — ED Notes (Signed)
Dr. Tyson AliasFeinstein in to see pt -- spoke with family.

## 2016-11-08 NOTE — Progress Notes (Signed)
CRITICAL VALUE ALERT  Critical value received:  Lactic acid 3.8  Date of notification:  11/08/16  Time of notification:  1810  Critical value read back:Yes.    Nurse who received alert:  Tory EmeraldMichelle Latangela Mccomas, RN  MD notified (1st page):  Pola CornELINK MD  Time of first page:  1810  MD notified (2nd page):  Time of second page:  Responding MD:  Pola CornELINK MD  Time MD responded:  [phone]

## 2016-11-08 NOTE — H&P (Signed)
PULMONARY / CRITICAL CARE MEDICINE   Name: Andrew [name] MRN: [id number] DOB: 1956/05/16    ADMISSION DATE:  11/08/2016  REFERRING MD:  EDP   CHIEF COMPLAINT:  AMS, respiratory failure, aspiration   HISTORY OF PRESENT ILLNESS:   60 yo male with hx IDDM, GERD, HTN, OSA presented 11/22 after being found unresponsive at home for unknown period of time.  He lives with his wife but she has been in SNF for the last month.  Was last seen normal 2 days prior although family is trying to determine if his home health aide saw him in the interim.  Was found to have glucose 21, no improvement in mental status with D50.  He was bagged by EMS en route and had multiple episodes of vomiting with obvious aspiration.  Intubated on arrival to ER and PCCM called to admit.  Of note, daughter concerned that he has been very depressed the last few weeks.  There were family pictures strewn about the house when family arrived.  He has had little to no PO intake but continues to take his insulin per family.      PAST MEDICAL HISTORY :  He  has a past medical history of Blindness; Diabetes mellitus (HCC); GERD (gastroesophageal reflux disease); HTN (hypertension); Hyperlipidemia; OSA (obstructive sleep apnea); and Tachycardia.  PAST SURGICAL HISTORY: He  has a past surgical history that includes Cholecystectomy and transthoracic echocardiogram (08/25/2010).  Allergies  Allergen Reactions  . Lisinopril     cough    No current facility-administered medications on file prior to encounter.    Current Outpatient Prescriptions on File Prior to Encounter  Medication Sig  . BD PEN NEEDLE NANO U/F 32G X 4 MM MISC USE AS DIRECTED  . esomeprazole (NEXIUM) 40 MG capsule Take 40 mg by mouth 2 (two) times daily before a meal.   . famotidine (PEPCID) 20 MG tablet Take 20 mg by mouth at bedtime.  . fexofenadine (ALLEGRA) 180 MG tablet   . gabapentin (NEURONTIN) 300 MG capsule Take 300 mg by mouth 3 (three) times daily.  .  Insulin Glargine (LANTUS SOLOSTAR) 100 UNIT/ML Solostar Pen Inject 200 Units into the skin every morning. And pen needles 3/day  . losartan (COZAAR) 100 MG tablet   . metoprolol succinate (TOPROL-XL) 25 MG 24 hr tablet Take 25 mg by mouth daily.   . pantoprazole (PROTONIX) 40 MG tablet   . PROAIR HFA 108 (90 BASE) MCG/ACT inhaler Reported on 12/03/2015  . SYMBICORT 160-4.5 MCG/ACT inhaler   . zolpidem (AMBIEN) 10 MG tablet Take 10 mg by mouth at bedtime as needed.    FAMILY HISTORY:  His has no family status information on file.    SOCIAL HISTORY: He  reports that he has never smoked. He has never used smokeless tobacco. He reports that he drinks alcohol. He reports that he does not use drugs.  REVIEW OF SYSTEMS:   Unable, as per HPI above obtained from family and ED staff.   SUBJECTIVE:    VITAL SIGNS: BP (!) 141/111   Pulse 102   Temp (!) 96.3 F (35.7 C) (Core (Comment))   Resp 18   Ht 6' (1.829 m)   Wt 115.2 kg (253 lb 15.5 oz) Comment: per previous visit  SpO2 100%   BMI 34.44 kg/m   HEMODYNAMICS:    VENTILATOR SETTINGS: Vent Mode: PRVC FiO2 (%):  [40 %-100 %] 40 % Set Rate:  [18 bmp] 18 bmp Vt Set:  [409 mL] 620  mL PEEP:  [8 cmH20] 8 cmH20 Plateau Pressure:  [29 cmH20] 29 cmH20  INTAKE / OUTPUT: No intake/output data recorded.  PHYSICAL EXAMINATION: General:  Chronically ill appearing male, unresponsive  Neuro:  Unresponsive on low dose propofol (unclear why started, has had no response per ER staff), minimal gag HEENT:  Mm dry, ETT  Cardiovascular:  s1s2 rrr Lungs:  resps even non labored on vent, scattered rhonchi  Abdomen:  Round, soft, hypoactive bs  Musculoskeletal:  Warm and dry, no edema, venous stasis   LABS:  BMET  Recent Labs Lab 11/08/16 1021 11/08/16 1050  NA 144 146*  K 4.9 4.6  CL 110 108  CO2 23  --   BUN 23* 34*  CREATININE 1.97* 2.10*  GLUCOSE 100* 95    Electrolytes  Recent Labs Lab 11/08/16 1021  CALCIUM 8.7*     CBC  Recent Labs Lab 11/08/16 1021 11/08/16 1050  WBC 21.8*  --   HGB 16.0 17.7*  HCT 48.8 52.0  PLT 156  --     Coag's No results for input(s): APTT, INR in the last 168 hours.  Sepsis Markers  Recent Labs Lab 11/08/16 1050  LATICACIDVEN 4.13*    ABG  Recent Labs Lab 11/08/16 1146  PHART 7.404  PCO2ART 37.2  PO2ART 192.0*    Liver Enzymes  Recent Labs Lab 11/08/16 1021  AST 218*  ALT 92*  ALKPHOS 80  BILITOT 1.1  ALBUMIN 3.4*    Cardiac Enzymes No results for input(s): TROPONINI, PROBNP in the last 168 hours.  Glucose  Recent Labs Lab 11/08/16 1018  GLUCAP 78    Imaging Ct Head Wo Contrast  Result Date: 11/08/2016 CLINICAL DATA:  Altered mental status.  Found unresponsive. EXAM: CT HEAD WITHOUT CONTRAST TECHNIQUE: Contiguous axial images were obtained from the base of the skull through the vertex without intravenous contrast. COMPARISON:  None. FINDINGS: Brain: No acute intracranial abnormality. Specifically, no hemorrhage, hydrocephalus, mass lesion, acute infarction, or significant intracranial injury. Vascular: No hyperdense vessel or unexpected calcification. Skull: No acute calvarial abnormality. Sinuses/Orbits: Mucosal thickening throughout the paranasal sinuses. Mastoid air cells are clear. Small calcified globes bilaterally. Other: None IMPRESSION: No acute intracranial abnormality. Chronic sinusitis. Electronically Signed   By: Charlett NoseKevin  Dover M.D.   On: 11/08/2016 11:18   Dg Chest Port 1 View  Result Date: 11/08/2016 CLINICAL DATA:  Shortness of breath.  Post intubation. EXAM: PORTABLE CHEST 1 VIEW COMPARISON:  06/09/2016 FINDINGS: Grossly unchanged cardiac silhouette and mediastinal contours. Endotracheal tube overlies the tracheal air column with tip approximately 3.2 cm above the carina. Enteric tube tip and side port project below left hemidiaphragm. Epicardial pacer pads overlie the cardiac apex. Ill-defined heterogeneous potential  airspace opacities within the bilateral lower lungs. No pleural effusion or pneumothorax. No definite evidence of edema. No acute osseus abnormalities. Post cholecystectomy. IMPRESSION: 1. Appropriately positioned support apparatus as above. No pneumothorax. 2. Bibasilar heterogeneous airspace opacities worrisome for infection and/or aspiration. Continued attention on follow-up is recommended. Electronically Signed   By: Simonne ComeJohn  Watts M.D.   On: 11/08/2016 10:46   Dg Abd Portable 1v  Result Date: 11/08/2016 CLINICAL DATA:  Post enteric tube placement. EXAM: PORTABLE ABDOMEN - 1 VIEW COMPARISON:  Chest radiograph - earlier same day FINDINGS: Enteric tube tip and side port projected the expected location of the gastric antrum. Paucity of bowel gas without definite evidence of enteric obstruction. No supine evidence of pneumoperitoneum. No definite pneumatosis or portal venous gas. Limited visualization of the  lower thorax demonstrates bibasilar opacities, right greater than left. No definite acute osseus abnormalities.  Post cholecystectomy. IMPRESSION: 1. Enteric tube tip and side port project over the gastric antrum. 2. Paucity of bowel gas without evidence of enteric obstruction. Electronically Signed   By: Simonne Come M.D.   On: 11/08/2016 10:47     STUDIES:  EEG 11/22>>> Ct head 11/22>>>neg acute   CULTURES: Sputum 11/22>>>  ANTIBIOTICS: Zosyn 11/22>>>  SIGNIFICANT EVENTS:   LINES/TUBES: ETT 11/22>>>  DISCUSSION: 60 yo male with severe hypoglycemia and persistent AMS and acute respiratory failure with obvious aspiration.    ASSESSMENT / PLAN:  PULMONARY Acute respiratory failure  Aspiration PNA  P:   abx as above  Vent support - 8cc/kg  F/u CXR  F/u ABG   CARDIOVASCULAR Hx HTN  P:  Hold home cozaar, metoprolol for now - will likely need to resume  Trend BP   RENAL AKI - mild  R/o rhabdo  P:   Hydrate  Stat CK  F/u chem  Trend lactate    GASTROINTESTINAL Elevated transaminases P:   Trend LFT's  NPO for now  pepcid   HEMATOLOGIC No active issue  P:  F/u CBC  SQ heparin   INFECTIOUS Aspiration PNA - will cover nosocomial as he has been visiting his wife frequently in SNF the last month P:   Zosyn as above  Sputum culture  Trend pct  ENDOCRINE IDDM  Severe hypoglycemia -- unclear if suicide attempt given depression s/s, pictures strewn about the home   P:   D5 in fluids  q1h CBG  Check UDS, tylenol, salicylate levels   NEUROLOGIC AMS - r/t severe hypoglycemia with unknown down time.  Last seen normal 2 days prior to admit P:   RASS goal: 0 Low dose propofol  PRN fentanyl Daily WUA  EEG  Consider MRI if no improvement next 24-48 hours  Correct glucose as above    FAMILY  - Updates:  Daughter and wife updated at length at bedside 11/22  - Inter-disciplinary family meet or Palliative Care meeting due by:  11/29   Dirk Dress, NP 11/08/2016  12:31 PM Pager: [phone] or [phone]  STAFF NOTE: Cindi Carbon, MD FACP have personally reviewed patient's available data, including medical history, events of note, physical examination and test results as part of my evaluation. I have discussed with resident/NP and other care providers such as pharmacist, RN and RRT. In addition, I personally evaluated patient and elicited key findings of: likley was on ground and alone for 24 hours, found glu 21, now unresponsive on vent, ronchi lower bases, no edema, pcxr with asp pna, very concerned about prolonged  hypoglycemic brain injury, and anoxia, CT head neg, egt eeg, limit sedation, no treat glu unless greater 200 x 2, assess tylenal, sali, urine tox screen, treat qwith nosocomial zosyn as he has been visiting his wife in NH for weeks, asses BC, sputrum, lactic is NOt sepsis, allow pos ablcne, asssess rhabdo cpk and hydrate, abg in am , pcxr in am, no feed as of now, NGT and assess output, I  updated family in room, concenred given for poor outcome The patient is critically ill with multiple organ systems failure and requires high complexity decision making for assessment and support, frequent evaluation and titration of therapies, application of advanced monitoring technologies and extensive interpretation of multiple databases.   Critical Care Time devoted to patient care services described in this note is 35 Minutes. This  time reflects time of care of this signee: Rory Percyaniel Feinstein, MD FACP. This critical care time does not reflect procedure time, or teaching time or supervisory time of PA/NP/Med student/Med Resident etc but could involve care discussion time. Rest per NP/medical resident whose note is outlined above and that I agree with   Mcarthur Rossettianiel J. Tyson AliasFeinstein, MD, FACP Pgr: [phone] [facility] 11/08/2016 12:57 PM

## 2016-11-08 NOTE — Procedures (Signed)
ELECTROENCEPHALOGRAM REPORT  Date of Study: 11/08/2016  Patient's Name: Andrew [name] MRN: [id number] Date of Birth: 1956-08-14   Referring Provider: Danford BadKathryn Whiteheart, NP  Clinical History: This is a 60 year old man found unresponsive.  Medications: propofol (DIPRIVAN) 1000 MG/100ML infusion  albuterol (PROVENTIL) (2.5 MG/3ML) 0.083% nebulizer solution 2.5 mg  dextrose 5 %-0.9 % sodium chloride infusion  famotidine (PEPCID) IVPB 20 mg premix  piperacillin-tazobactam (ZOSYN) IVPB 3.375 g   Technical Summary: A multichannel digital EEG recording measured by the international 10-20 system with electrodes applied with paste and impedances below 5000 ohms performed in our laboratory with EKG monitoring in an intubated and sedated patient.  Hyperventilation and photic stimulation were not performed.  The digital EEG was referentially recorded, reformatted, and digitally filtered in a variety of bipolar and referential montages for optimal display.    Description: The patient is intubated and sedated on Propofol during the recording. There is loss of normal background activity. There is diffuse suppression of background activity. There is no spontaneous reactivity or reactivity noted with noxious stimulation. There is muscle artifact over the frontal leads majority of the recording, in between artifact, there were no epileptiform discharges or electrographic seizures seen.   EKG lead was unremarkable.  Impression: This EEG is markedly abnormal due to diffuse background suppression and lack of EEG reactivity with noxious stimulation.  Clinical Correlation of the above findings indicates severe diffuse cerebral dysfunction that is non-specific in etiology and can be seen in the setting of anoxic/ischemic injury, toxic/metabolic encephalopathies, or medication effect. In the absence of CNS active, sedating, or anesthetic medications, this suggests a poor prognosis. Clinical correlation is  advised.  Andrew [name], M.D.

## 2016-11-08 NOTE — ED Notes (Signed)
EEG on way to ED to see patient

## 2016-11-08 NOTE — Progress Notes (Signed)
EEG Completed; Results Pending  

## 2016-11-08 NOTE — Progress Notes (Addendum)
Pharmacy Antibiotic Note  Andrew [name] is a 60 y.o. male admitted on 11/08/2016 with pneumonia.  Pharmacy has been consulted for zosyn and vancomycin dosing.  Patient came to ED unresponsive and vomiting  Plan: Zosyn 3.375g IV q8h (4 hour infusion).  Vancomycin 2500 mg x 1 then 1250 mg q24h  Height: 6' (182.9 cm) Weight: 253 lb 15.5 oz (115.2 kg) (per previous visit) IBW/kg (Calculated) : 77.6  No data recorded.  No results for input(s): WBC, CREATININE, LATICACIDVEN, VANCOTROUGH, VANCOPEAK, VANCORANDOM, GENTTROUGH, GENTPEAK, GENTRANDOM, TOBRATROUGH, TOBRAPEAK, TOBRARND, AMIKACINPEAK, AMIKACINTROU, AMIKACIN in the last 168 hours.  CrCl cannot be calculated (Patient's most recent lab result is older than the maximum 21 days allowed.).    Allergies  Allergen Reactions  . Lisinopril     cough   Isaac BlissMichael Dewaun Kinzler, PharmD, BCPS, [facility] CenterBCCCP Clinical Pharmacist Phone [phone] 11/08/2016 10:48 AM

## 2016-11-08 NOTE — Progress Notes (Signed)
Patient was found by daughter and called 911.  Pt. was brought to Ed vis EMS as CPR. Patient is currently intubated. Family at bedside. Patient possibly going to be admitted. Provided emotional and spiritual support and ministry of presence and hospitality.    11/08/16 1100  Clinical Encounter Type  Visited With Patient;Family;Patient and family together;Health care provider  Visit Type Initial;Spiritual support  Referral From Nurse  Spiritual Encounters  Spiritual Needs Prayer;Emotional  Stress Factors  Family Stress Factors Exhausted;Health changes  Venida JarvisWatlington, Vinnie Bobst, VirginiaChaplain,pager [phone]

## 2016-11-08 NOTE — ED Provider Notes (Signed)
[facility]-EMERGENCY DEPT Provider Note   CSN: [id number] Arrival date & time: 11/08/16  1018     History   Chief Complaint No chief complaint on file. Unresponsiveness  HPI Andrew [name] is a 60 y.o. male.  HPI 60 year old male with history of obstructive sleep apnea, obesity, hypertension, hyperlipidemia, diabetes on insulin who presents with altered mental status. According to EMS report, patient was found unresponsive, actively vomiting by his daughter and his house today. He lives with his wife, but his wife has been hospitalized last month. On their arrival, blood sugar was 21. He received an amp of D50 but had no improvement in his mental status. He persisted to actively vomiting en route and was difficult to bag. He arrives with bag valve mask assisted ventilations. Remainder of history limited due to acuity and unresponsiveness.  On further interview with the patients daughter, she is concerned the patient has been depressed for the last several weeks due to his wife's illness. He has had little to no by mouth intake and has lost his appetite. He has had multiple episodes of hypoglycemia since then. Of note, he has also had cough productive of sputum over the last several days. He has been visiting his wife regarding the hospital.  Past Medical History:  Diagnosis Date  . Blindness   . Diabetes mellitus (HCC)   . GERD (gastroesophageal reflux disease)   . HTN (hypertension)   . Hyperlipidemia   . OSA (obstructive sleep apnea)    AHI 56.3/hr, loud snoring, desaturation to 73% on RA (Dr. Jetty Duhamel)  . Tachycardia    resting; on low dose B-Blocker    Patient Active Problem List   Diagnosis Date Noted  . Hypoglycemia 11/08/2016  . Encephalopathy   . SOB (shortness of breath)   . Encounter for orogastric (OG) tube placement   . Allergic rhinitis, cause unspecified 09/04/2013  . 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 Type 1 diabetes mellitus (HCC) 09/03/2013    . Rapid heart rate 08/20/2013  . Morbid obesity (HCC) 07/13/2012  . Cough 06/10/2012  . Osteoarthritis 04/03/2011    Past Surgical History:  Procedure Laterality Date  . CHOLECYSTECTOMY    . TRANSTHORACIC ECHOCARDIOGRAM  08/25/2010   normal; EF=>55%; mild-mod conc LVH; mild MR       Home Medications    Prior to Admission medications   Medication Sig Start Date End Date Taking? Authorizing Provider  BD PEN NEEDLE NANO U/F 32G X 4 MM MISC USE AS DIRECTED 05/31/15   Romero Belling, MD  esomeprazole (NEXIUM) 40 MG capsule Take 40 mg by mouth 2 (two) times daily before a meal.     Historical Provider, MD  famotidine (PEPCID) 20 MG tablet Take 20 mg by mouth at bedtime.    Historical Provider, MD  fexofenadine (ALLEGRA) 180 MG tablet  07/17/13   Historical Provider, MD  gabapentin (NEURONTIN) 300 MG capsule Take 300 mg by mouth 3 (three) times daily.    Historical Provider, MD  Insulin Glargine (LANTUS SOLOSTAR) 100 UNIT/ML Solostar Pen Inject 200 Units into the skin every morning. And pen needles 3/day 08/07/16   Romero Belling, MD  losartan (COZAAR) 100 MG tablet  09/11/13   Historical Provider, MD  metoprolol succinate (TOPROL-XL) 25 MG 24 hr tablet Take 25 mg by mouth daily.  07/07/13   Historical Provider, MD  pantoprazole (PROTONIX) 40 MG tablet  10/23/13   Historical Provider, MD  PROAIR HFA 108 (90 BASE) MCG/ACT inhaler Reported on  12/03/2015 06/10/13   Historical Provider, MD  SYMBICORT 160-4.5 MCG/ACT inhaler  03/30/15   Historical Provider, MD  zolpidem (AMBIEN) 10 MG tablet Take 10 mg by mouth at bedtime as needed.    Historical Provider, MD    Family History Family History  Problem Relation Age of Onset  . Cancer Paternal Grandmother   . Diabetes Father   . Diabetes Brother   . Diabetes Maternal Grandmother     Social History Social History  Substance Use Topics  . Smoking status: Never Smoker  . Smokeless tobacco: Never Used  . Alcohol use Yes     Comment: couples times a  year     Allergies   Lisinopril   Review of Systems Review of Systems  Unable to perform ROS: Patient unresponsive     Physical Exam Updated Vital Signs BP 111/65   Pulse 118   Temp (!) 96.3 F (35.7 C) (Core (Comment))   Resp 22   Ht 6' (1.829 m)   Wt 253 lb 15.5 oz (115.2 kg) Comment: per previous visit  SpO2 99%   BMI 34.44 kg/m   Physical Exam  Constitutional:  Obese African-American male in extremities. Gasping respirations, moaning, but no responsiveness to pain or noxious stimuli.  HENT:  Large amount of brown emesis covering the face and pooling in the oropharynx.  Eyes: Conjunctivae are normal. Pupils are equal, round, and reactive to light.  Neck: Neck supple.  Cardiovascular: Tachycardia present.  Exam reveals no friction rub.   No murmur heard. Pulmonary/Chest:  Irregular, agonal respirations with coarse rhonchi throughout all lung fields.  Abdominal: Soft. There is no guarding.  Musculoskeletal: He exhibits no edema.  Neurological: GCS eye subscore is 1. GCS verbal subscore is 2. GCS motor subscore is 1.  Nursing note and vitals reviewed.    ED Treatments / Results  Labs (all labs ordered are listed, but only abnormal results are displayed) Labs Reviewed  CBC WITH DIFFERENTIAL/PLATELET - Abnormal; Notable for the following:       Result Value   WBC 21.8 (*)    RDW 15.6 (*)    Neutro Abs 18.3 (*)    Monocytes Absolute 2.4 (*)    All other components within normal limits  COMPREHENSIVE METABOLIC PANEL - Abnormal; Notable for the following:    Glucose, Bld 100 (*)    BUN 23 (*)    Creatinine, Ser 1.97 (*)    Calcium 8.7 (*)    Albumin 3.4 (*)    AST 218 (*)    ALT 92 (*)    GFR calc non Af Amer 35 (*)    GFR calc Af Amer 41 (*)    All other components within normal limits  URINALYSIS, ROUTINE W REFLEX MICROSCOPIC (NOT AT [facility]) - Abnormal; Notable for the following:    Color, Urine AMBER (*)    APPearance HAZY (*)    Hgb urine dipstick  LARGE (*)    Bilirubin Urine SMALL (*)    Ketones, ur 15 (*)    Protein, ur >300 (*)    All other components within normal limits  URINE MICROSCOPIC-ADD ON - Abnormal; Notable for the following:    Squamous Epithelial / LPF 0-5 (*)    Bacteria, UA FEW (*)    Casts GRANULAR CAST (*)    All other components within normal limits  CK - Abnormal; Notable for the following:    Total CK 39,731 (*)    All other components within normal limits  ACETAMINOPHEN LEVEL - Abnormal; Notable for the following:    Acetaminophen (Tylenol), Serum <10 (*)    All other components within normal limits  CBC - Abnormal; Notable for the following:    WBC 16.4 (*)    RDW 15.9 (*)    Platelets 116 (*)    All other components within normal limits  CREATININE, SERUM - Abnormal; Notable for the following:    Creatinine, Ser 1.71 (*)    GFR calc non Af Amer 42 (*)    GFR calc Af Amer 49 (*)    All other components within normal limits  LACTIC ACID, PLASMA - Abnormal; Notable for the following:    Lactic Acid, Venous 3.6 (*)    All other components within normal limits  BASIC METABOLIC PANEL - Abnormal; Notable for the following:    Sodium 146 (*)    Chloride 113 (*)    Glucose, Bld 53 (*)    BUN 23 (*)    Creatinine, Ser 1.71 (*)    Calcium 8.1 (*)    GFR calc non Af Amer 42 (*)    GFR calc Af Amer 49 (*)    All other components within normal limits  I-STAT CG4 LACTIC ACID, ED - Abnormal; Notable for the following:    Lactic Acid, Venous 4.13 (*)    All other components within normal limits  I-STAT CHEM 8, ED - Abnormal; Notable for the following:    Sodium 146 (*)    BUN 34 (*)    Creatinine, Ser 2.10 (*)    Calcium, Ion 1.05 (*)    Hemoglobin 17.7 (*)    All other components within normal limits  CBG MONITORING, ED - Abnormal; Notable for the following:    Glucose-Capillary 60 (*)    All other components within normal limits  I-STAT ARTERIAL BLOOD GAS, ED - Abnormal; Notable for the following:     pO2, Arterial 192.0 (*)    All other components within normal limits  CBG MONITORING, ED - Abnormal; Notable for the following:    Glucose-Capillary 113 (*)    All other components within normal limits  I-STAT CG4 LACTIC ACID, ED - Abnormal; Notable for the following:    Lactic Acid, Venous 3.91 (*)    All other components within normal limits  CULTURE, BLOOD (ROUTINE X 2)  CULTURE, BLOOD (ROUTINE X 2)  URINE CULTURE  RAPID URINE DRUG SCREEN, HOSP PERFORMED  SALICYLATE LEVEL  PROCALCITONIN  MAGNESIUM  PHOSPHORUS  LACTIC ACID, PLASMA  BASIC METABOLIC PANEL  BASIC METABOLIC PANEL  BASIC METABOLIC PANEL  CBC  I-STAT TROPOININ, ED  CBG MONITORING, ED  I-STAT TROPOININ, ED  I-STAT TROPOININ, ED  CBG MONITORING, ED  CBG MONITORING, ED    EKG  EKG Interpretation  Date/Time:  Wednesday November 08 2016 10:19:13 EST Ventricular Rate:  136 PR Interval:    QRS Duration: 104 QT Interval:  310 QTC Calculation: 467 R Axis:   -95 Text Interpretation:  Sinus tachycardia Incomplete RBBB and LAFB Consider right ventricular hypertrophy Consider anterior infarct No old tracing to compare Diffuse ST changes, likely demand Confirmed by Brooklynne Pereida MD, Dvonte Gatliff [phone]) on 11/08/2016 5:11:45 PM       Radiology Ct Head Wo Contrast  Result Date: 11/08/2016 CLINICAL DATA:  Altered mental status.  Found unresponsive. EXAM: CT HEAD WITHOUT CONTRAST TECHNIQUE: Contiguous axial images were obtained from the base of the skull through the vertex without intravenous contrast. COMPARISON:  None. FINDINGS: Brain: No acute intracranial  abnormality. Specifically, no hemorrhage, hydrocephalus, mass lesion, acute infarction, or significant intracranial injury. Vascular: No hyperdense vessel or unexpected calcification. Skull: No acute calvarial abnormality. Sinuses/Orbits: Mucosal thickening throughout the paranasal sinuses. Mastoid air cells are clear. Small calcified globes bilaterally. Other: None  IMPRESSION: No acute intracranial abnormality. Chronic sinusitis. Electronically Signed   By: Charlett NoseKevin  Dover M.D.   On: 11/08/2016 11:18   Dg Chest Port 1 View  Result Date: 11/08/2016 CLINICAL DATA:  Shortness of breath.  Post intubation. EXAM: PORTABLE CHEST 1 VIEW COMPARISON:  06/09/2016 FINDINGS: Grossly unchanged cardiac silhouette and mediastinal contours. Endotracheal tube overlies the tracheal air column with tip approximately 3.2 cm above the carina. Enteric tube tip and side port project below left hemidiaphragm. Epicardial pacer pads overlie the cardiac apex. Ill-defined heterogeneous potential airspace opacities within the bilateral lower lungs. No pleural effusion or pneumothorax. No definite evidence of edema. No acute osseus abnormalities. Post cholecystectomy. IMPRESSION: 1. Appropriately positioned support apparatus as above. No pneumothorax. 2. Bibasilar heterogeneous airspace opacities worrisome for infection and/or aspiration. Continued attention on follow-up is recommended. Electronically Signed   By: Simonne ComeJohn  Watts M.D.   On: 11/08/2016 10:46   Dg Abd Portable 1v  Result Date: 11/08/2016 CLINICAL DATA:  Post enteric tube placement. EXAM: PORTABLE ABDOMEN - 1 VIEW COMPARISON:  Chest radiograph - earlier same day FINDINGS: Enteric tube tip and side port projected the expected location of the gastric antrum. Paucity of bowel gas without definite evidence of enteric obstruction. No supine evidence of pneumoperitoneum. No definite pneumatosis or portal venous gas. Limited visualization of the lower thorax demonstrates bibasilar opacities, right greater than left. No definite acute osseus abnormalities.  Post cholecystectomy. IMPRESSION: 1. Enteric tube tip and side port project over the gastric antrum. 2. Paucity of bowel gas without evidence of enteric obstruction. Electronically Signed   By: Simonne ComeJohn  Watts M.D.   On: 11/08/2016 10:47    Procedures .Critical Care Performed by: Shaune PollackISAACS,  Taya Ashbaugh Authorized by: Shaune PollackISAACS, Generoso Cropper   Critical care provider statement:    Critical care time (minutes):  35   Critical care time was exclusive of:  Separately billable procedures and treating other patients   Critical care was necessary to treat or prevent imminent or life-threatening deterioration of the following conditions:  Cardiac failure, circulatory failure, respiratory failure and dehydration   Critical care was time spent personally by me on the following activities:  Development of treatment plan with patient or surrogate, discussions with consultants, evaluation of patient's response to treatment, examination of patient, obtaining history from patient or surrogate, ventilator management, ordering and performing treatments and interventions, ordering and review of laboratory studies, ordering and review of radiographic studies, pulse oximetry, re-evaluation of patient's condition and review of old charts   I assumed direction of critical care for this patient from another provider in my specialty: no   Procedure Name: Intubation Date/Time: 11/08/2016 5:12 PM Performed by: Shaune PollackISAACS, Haik Mahoney Pre-anesthesia Checklist: Patient identified, Emergency Drugs available, Suction available, Patient being monitored and Timeout performed Oxygen Delivery Method: Ambu bag Preoxygenation: Pre-oxygenation with 100% oxygen Intubation Type: IV induction Ventilation: Mask ventilation with difficulty, Two handed mask ventilation required and Nasal airway inserted- appropriate to patient size Laryngoscope Size: Mac and 4 Grade View: Grade I Tube size: 8.0 mm Number of attempts: 1 Airway Equipment and Method: Rigid stylet and Video-laryngoscopy Placement Confirmation: ETT inserted through vocal cords under direct vision,  Positive ETCO2,  CO2 detector and Breath sounds checked- equal and bilateral Secured at: 23 cm  Tube secured with: ETT holder Dental Injury: Teeth and Oropharynx as per pre-operative  assessment  Difficulty Due To: Difficulty was anticipated Future Recommendations: Recommend- induction with short-acting agent, and alternative techniques readily available      (including critical care time)  Medications Ordered in ED Medications  fentaNYL (SUBLIMAZE) injection 100 mcg (not administered)  fentaNYL (SUBLIMAZE) injection 100 mcg (not administered)  propofol (DIPRIVAN) 1000 MG/100ML infusion (28 mcg/kg/min  115.2 kg Intravenous Rate/Dose Change 11/08/16 1229)  piperacillin-tazobactam (ZOSYN) IVPB 3.375 g (not administered)  sodium chloride 0.9 % bolus 1,000 mL (0 mLs Intravenous Stopped 11/08/16 1309)    And  sodium chloride 0.9 % bolus 1,000 mL (0 mLs Intravenous Stopped 11/08/16 1550)    And  sodium chloride 0.9 % bolus 1,000 mL (1,000 mLs Intravenous New Bag/Given 11/08/16 1311)    And  sodium chloride 0.9 % bolus 500 mL (not administered)  heparin injection 5,000 Units (not administered)  famotidine (PEPCID) IVPB 20 mg premix (not administered)  albuterol (PROVENTIL) (2.5 MG/3ML) 0.083% nebulizer solution 2.5 mg (not administered)  propofol (DIPRIVAN) 1000 MG/100ML infusion (not administered)  etomidate (AMIDATE) injection (20 mg Intravenous Given 11/08/16 1013)  succinylcholine (ANECTINE) injection (100 mg Intravenous Given 11/08/16 1014)  propofol (DIPRIVAN) 500 MG/50ML infusion (10 mcg/kg/min  115.2 kg Intravenous New Bag/Given 11/08/16 1022)  dextrose 50 % solution (not administered)  dextrose 10 % infusion (not administered)  piperacillin-tazobactam (ZOSYN) IVPB 3.375 g (0 g Intravenous Stopped 11/08/16 1213)     Initial Impression / Assessment and Plan / ED Course  I have reviewed the triage vital signs and the nursing notes.  Pertinent labs & imaging results that were available during my care of the patient were reviewed by me and considered in my medical decision making (see chart for details).  Clinical Course     60 year old male with past  nuchal history as above who arrives minimally responsive, actively vomiting in the setting of hypoglycemia. Last known normal unknown. On arrival, patient with active vomiting and frank aspiration. Patient subsequently immediately intubated uneventfully with 8-0 ET tube. X-ray confirms appropriate ET tube placement as well as likely bibasilar aspiration pneumonia. Will cover empirically for this, start broad-spectrum antibiotics, and fluids. Otherwise, patient is tachycardic but hemodynamically stable. Blood pressure is normal. Will send for stat CT head. EKG shows no ischemia to explain his hyperglycemia. Based on discussion with patient's daughter, concern for possible insulin related hypoglycemia versus purposeful overdose versus pneumonia with subsequent dysregulation of blood sugars.  Labs and imaging reviewed as above. Patient has marked leukocytosis and lactic acidosis. This may be secondary to his frank aspiration prior to intubation, versus preceding pneumonia. Will cover with IV vancomycin and Zosyn. IV fluids have been given an code sepsis initiated. Otherwise, labs show mild acute kidney injury. Will continue fluids and admit to the ICU. Family updated and aware of patient's critical status.  Final Clinical Impressions(s) / ED Diagnoses   Final diagnoses:  SOB (shortness of breath)  Acute respiratory failure with hypoxia (HCC)  Hypoglycemia  Aspiration pneumonia of both lower lobes due to vomit (HCC)  AKI (acute kidney injury) [facility](HCC)    New Prescriptions New Prescriptions   No medications on file     Shaune Pollackameron Dorris Vangorder, MD 11/08/16 [phone]

## 2016-11-08 NOTE — Progress Notes (Signed)
Pt arrived from ER with clothes cut off. Clothes soiled with stool and thrown away. Pts pockets checked and items taken down to security and locked up. Items included 1 black wallet with $120 inside, 1 black clip that had several credit/debit cards and license, 1 pocket knife, 1 keys change, 1 silver colored ring and some and some change. Items counted with Corliss SkainsJuan Claudio, RN and taken to security.

## 2016-11-08 NOTE — Code Documentation (Signed)
Pt to ED via [facility] with c/o found unresponsive by daughter this am-- last seen normal was Monday night. Pt has been living aloone, wife in nursing home for rehab, with family checking on him, he is blind, has home health aid that comes daily.  Pt had CBG of 27, vomit around pt and in mouth, pt was being bagged on arrival. [facility]

## 2016-11-09 ENCOUNTER — Inpatient Hospital Stay (HOSPITAL_COMMUNITY): Payer: Medicare Other

## 2016-11-09 DIAGNOSIS — N179 Acute kidney failure, unspecified: Secondary | ICD-10-CM

## 2016-11-09 DIAGNOSIS — J690 Pneumonitis due to inhalation of food and vomit: Secondary | ICD-10-CM

## 2016-11-09 DIAGNOSIS — J9601 Acute respiratory failure with hypoxia: Secondary | ICD-10-CM

## 2016-11-09 DIAGNOSIS — J9600 Acute respiratory failure, unspecified whether with hypoxia or hypercapnia: Secondary | ICD-10-CM

## 2016-11-09 LAB — GLUCOSE, CAPILLARY
GLUCOSE-CAPILLARY: 117 mg/dL — AB (ref 65–99)
GLUCOSE-CAPILLARY: 129 mg/dL — AB (ref 65–99)
GLUCOSE-CAPILLARY: 160 mg/dL — AB (ref 65–99)
GLUCOSE-CAPILLARY: 164 mg/dL — AB (ref 65–99)
GLUCOSE-CAPILLARY: 75 mg/dL (ref 65–99)
GLUCOSE-CAPILLARY: 90 mg/dL (ref 65–99)
GLUCOSE-CAPILLARY: 98 mg/dL (ref 65–99)
Glucose-Capillary: 99 mg/dL (ref 65–99)
Glucose-Capillary: 263 mg/dL — ABNORMAL HIGH (ref 65–99)

## 2016-11-09 LAB — ETHANOL: Alcohol, Ethyl (B): <5 mg/dL (ref <5)

## 2016-11-09 LAB — BLOOD GAS, ARTERIAL
ACID-BASE DEFICIT: 4.6 mmol/L — AB (ref 0.0–2.0)
Bicarbonate: 19 mmol/L — ABNORMAL LOW (ref 20.0–28.0)
DRAWN BY: 345601
FIO2: 40
MECHVT: 620 mL
O2 SAT: 95.4 %
PATIENT TEMPERATURE: 97.3
PCO2 ART: 28.8 mmHg — AB (ref 32.0–48.0)
PEEP/CPAP: 5 cmH2O
PH ART: 7.431 (ref 7.350–7.450)
RATE: 18 resp/min
pO2, Arterial: 78.1 mmHg — ABNORMAL LOW (ref 83.0–108.0)

## 2016-11-09 LAB — BASIC METABOLIC PANEL
ANION GAP: 11 (ref 5–15)
Anion gap: 10 (ref 5–15)
Anion gap: 10 (ref 5–15)
BUN: 27 mg/dL — ABNORMAL HIGH (ref 6–20)
BUN: 27 mg/dL — ABNORMAL HIGH (ref 6–20)
BUN: 28 mg/dL — ABNORMAL HIGH (ref 6–20)
CALCIUM: 7.1 mg/dL — AB (ref 8.9–10.3)
CHLORIDE: 120 mmol/L — AB (ref 101–111)
CHLORIDE: 118 mmol/L — AB (ref 101–111)
CHLORIDE: 120 mmol/L — AB (ref 101–111)
CO2: 17 mmol/L — ABNORMAL LOW (ref 22–32)
CO2: 15 mmol/L — AB (ref 22–32)
CO2: 17 mmol/L — AB (ref 22–32)
CREATININE: 2.3 mg/dL — AB (ref 0.61–1.24)
CREATININE: 2.38 mg/dL — AB (ref 0.61–1.24)
CREATININE: 2.42 mg/dL — AB (ref 0.61–1.24)
Calcium: 7.2 mg/dL — ABNORMAL LOW (ref 8.9–10.3)
Calcium: 7.1 mg/dL — ABNORMAL LOW (ref 8.9–10.3)
GFR calc Af Amer: 32 mL/min — ABNORMAL LOW (ref >60)
GFR calc non Af Amer: 28 mL/min — ABNORMAL LOW (ref >60)
GFR calc non Af Amer: 28 mL/min — ABNORMAL LOW (ref >60)
GFR calc non Af Amer: 29 mL/min — ABNORMAL LOW (ref >60)
GFR, EST AFRICAN AMERICAN: 34 mL/min — AB (ref >60)
GFR, EST AFRICAN AMERICAN: 33 mL/min — AB (ref >60)
GLUCOSE: 131 mg/dL — AB (ref 65–99)
GLUCOSE: 115 mg/dL — AB (ref 65–99)
GLUCOSE: 90 mg/dL (ref 65–99)
POTASSIUM: 4.6 mmol/L (ref 3.5–5.1)
Potassium: 4 mmol/L (ref 3.5–5.1)
Potassium: 5.3 mmol/L — ABNORMAL HIGH (ref 3.5–5.1)
Sodium: 145 mmol/L (ref 135–145)
Sodium: 146 mmol/L — ABNORMAL HIGH (ref 135–145)
Sodium: 147 mmol/L — ABNORMAL HIGH (ref 135–145)

## 2016-11-09 LAB — TRIGLYCERIDES: Triglycerides: 149 mg/dL (ref <150)

## 2016-11-09 LAB — LACTIC ACID, PLASMA: LACTIC ACID, VENOUS: 3.9 mmol/L — AB (ref 0.5–1.9)

## 2016-11-09 LAB — CBC
HCT: 50.2 % (ref 39.0–52.0)
HEMOGLOBIN: 15.8 g/dL (ref 13.0–17.0)
MCH: 28.2 pg (ref 26.0–34.0)
MCHC: 31.5 g/dL (ref 30.0–36.0)
MCV: 89.5 fL (ref 78.0–100.0)
Platelets: 75 10*3/uL — ABNORMAL LOW (ref 150–400)
RBC: 5.61 MIL/uL (ref 4.22–5.81)
RDW: 16 % — ABNORMAL HIGH (ref 11.5–15.5)
WBC: 11.1 10*3/uL — ABNORMAL HIGH (ref 4.0–10.5)

## 2016-11-09 LAB — URINE CULTURE: Culture: NO GROWTH

## 2016-11-09 MED ORDER — SODIUM CHLORIDE 0.9% FLUSH
10.0000 mL | INTRAVENOUS | Status: DC | PRN
  Administered 2016-11-18: 10 mL
  Filled 2016-11-09: qty 40

## 2016-11-09 MED ORDER — SODIUM CHLORIDE 0.9 % IV BOLUS (SEPSIS)
1000.0000 mL | Freq: Once | INTRAVENOUS | Status: AC
  Administered 2016-11-09: 1000 mL via INTRAVENOUS

## 2016-11-09 MED ORDER — WHITE PETROLATUM GEL
Status: AC
  Administered 2016-11-09: 11:00:00
  Filled 2016-11-09: qty 1

## 2016-11-09 MED ORDER — SODIUM CHLORIDE 0.9% FLUSH
10.0000 mL | Freq: Two times a day (BID) | INTRAVENOUS | Status: DC
  Administered 2016-11-09 – 2016-11-14 (×9): 10 mL
  Administered 2016-11-14 – 2016-11-15 (×3): 20 mL
  Administered 2016-11-17 – 2016-11-18 (×4): 10 mL
  Administered 2016-11-19 – 2016-11-20 (×4): 20 mL
  Administered 2016-11-21: 10 mL
  Administered 2016-11-21 – 2016-11-22 (×2): 20 mL
  Administered 2016-11-22 – 2016-11-24 (×4): 10 mL

## 2016-11-09 NOTE — Progress Notes (Signed)
eLink Physician-Brief Progress Note Patient Name: Andrew NeedsJohnny R [name] DOB: 1956-09-28 MRN: [id number]   Date of Service  11/09/2016  HPI/Events of Note  LA 3.8-->3.9  eICU Interventions  Will give 1 L NS bolus      Intervention Category Evaluation Type: Other  Erin FullingKurian Jeniece Hannis 11/09/2016, 12:57 AM

## 2016-11-09 NOTE — Progress Notes (Signed)
Hypoglycemic Event  CBG:53  Treatment: D50 IV 50 mL  Symptoms: None  Follow-up CBG: Time:2154 CBG Result:96  Possible Reasons for Event: Unknown     Layton Naves P Wofford

## 2016-11-09 NOTE — Progress Notes (Signed)
Peripherally Inserted Central Catheter/Midline Placement  The IV Nurse has discussed with the patient and/or persons authorized to consent for the patient, the purpose of this procedure and the potential benefits and risks involved with this procedure.  The benefits include less needle sticks, lab draws from the catheter, and the patient may be discharged home with the catheter. Risks include, but not limited to, infection, bleeding, blood clot (thrombus formation), and puncture of an artery; nerve damage and irregular heartbeat and possibility to perform a PICC exchange if needed/ordered by physician.  Alternatives to this procedure were also discussed.  Bard Power PICC patient education guide, fact sheet on infection prevention and patient information card has been provided to patient /or left at bedside.    PICC/Midline Placement Documentation     Consent signed by Daughter at bedside   Maximino GreenlandLumban, Ayvah Caroll Albarece 11/09/2016, 9:25 AM

## 2016-11-09 NOTE — Progress Notes (Addendum)
PULMONARY / CRITICAL CARE MEDICINE   Name: Sherran NeedsJohnny R Cefalu MRN: [id number] DOB: June 07, 1956    ADMISSION DATE:  11/08/2016  REFERRING MD:  EDP   CHIEF COMPLAINT:  AMS, respiratory failure, aspiration   HISTORY OF PRESENT ILLNESS:   60 yo male with hx IDDM, GERD, HTN, OSA presented 11/22 after being found unresponsive at home for unknown period of time.  He lives with his wife but she has been in SNF for the last month.  Was last seen normal 2 days prior although family is trying to determine if his home health aide saw him in the interim.  Was found to have glucose 21, no improvement in mental status with D50.  He was bagged by EMS en route and had multiple episodes of vomiting with obvious aspiration.  Intubated on arrival to ER and PCCM called to admit.  Of note, daughter concerned that he has been very depressed the last few weeks.  There were family pictures strewn about the house when family arrived.  He has had little to no PO intake but continues to take his insulin per family.       SUBJECTIVE:  Remains critically ill.  Not on pressors.  No purposeful mm. Not on sedatives. CBG better on D10 IVF.  Was dysynchronous with the vent so was placed on propofol drip  VITAL SIGNS: BP 120/74   Pulse (!) 108   Temp 99.5 F (37.5 C) (Oral)   Resp 18   Ht 6' (1.829 m)   Wt 110.9 kg (244 lb 7.8 oz)   SpO2 100%   BMI 33.16 kg/m   HEMODYNAMICS:    VENTILATOR SETTINGS: Vent Mode: PRVC FiO2 (%):  [40 %] 40 % Set Rate:  [18 bmp] 18 bmp Vt Set:  [620 mL] 620 mL PEEP:  [5 cmH20-8 cmH20] 5 cmH20 Plateau Pressure:  [24 cmH20-29 cmH20] 24 cmH20  INTAKE / OUTPUT: I/O last 3 completed shifts: In: 10490.1 [I.V.:1340.1; IV Piggyback:9150] Out: 2125 [Urine:2125]  PHYSICAL EXAMINATION: General:  Chronically ill appearing male, unresponsive  Neuro:  Unresponsive on low dose propofol. CN difficult to assess 2/2 propofol. Breathes over vent. (-) purposeful mm HEENT:  Mm dry, ETT   Cardiovascular:  s1s2 rrr Lungs:  resps even non labored on vent, scattered rhonchi  Abdomen:  Round, soft, hypoactive bs  Musculoskeletal:  Warm and dry, no edema, venous stasis   LABS:  BMET  Recent Labs Lab 11/08/16 2351 11/09/16 0236 11/09/16 0602  NA 145 146* 147*  K 5.3* 4.0 4.6  CL 120* 118* 120*  CO2 15* 17* 17*  BUN 27* 27* 28*  CREATININE 2.30* 2.38* 2.42*  GLUCOSE 131* 115* 90    Electrolytes  Recent Labs Lab 11/08/16 1330  11/08/16 2351 11/09/16 0236 11/09/16 0602  CALCIUM 8.1*  < > 7.1* 7.2* 7.1*  MG 2.0  --   --   --   --   PHOS 4.1  --   --   --   --   < > = values in this interval not displayed.  CBC  Recent Labs Lab 11/08/16 1021 11/08/16 1050 11/08/16 1330 11/09/16 0602  WBC 21.8*  --  16.4* 11.1*  HGB 16.0 17.7* 14.5 15.8  HCT 48.8 52.0 45.0 50.2  PLT 156  --  116* 75*    Coag's No results for input(s): APTT, INR in the last 168 hours.  Sepsis Markers  Recent Labs Lab 11/08/16 1330  11/08/16 1341 11/08/16 1656 11/08/16 2338  LATICACIDVEN  --   < >  3.91* 3.8* 3.9*  PROCALCITON 7.39  --   --   --   --   < > = values in this interval not displayed.  ABG  Recent Labs Lab 11/08/16 1146 11/09/16 0549  PHART 7.404 7.431  PCO2ART 37.2 28.8*  PO2ART 192.0* 78.1*    Liver Enzymes  Recent Labs Lab 11/08/16 1021  AST 218*  ALT 92*  ALKPHOS 80  BILITOT 1.1  ALBUMIN 3.4*    Cardiac Enzymes No results for input(s): TROPONINI, PROBNP in the last 168 hours.  Glucose  Recent Labs Lab 11/09/16 0051 11/09/16 0145 11/09/16 0243 11/09/16 0349 11/09/16 0452 11/09/16 0537  GLUCAP 90 75 98 99 117* 129*    Imaging Dg Chest Port 1 View  Result Date: 11/09/2016 CLINICAL DATA:  Shortness of breath.  Endotracheal tube. EXAM: PORTABLE CHEST 1 VIEW COMPARISON:  11/08/2016 FINDINGS: Patient is rotated to the right. Endotracheal tube with tip 2.9 cm above the carina. Nasogastric tube courses through the stomach with tip  over the right upper quadrant likely over the distal stomach or proximal duodenum. Lungs are somewhat hypoinflated with persistent focal opacification in right base and mild opacification over the left perihilar region/retrocardiac region. No definite effusion or pneumothorax. Cardiomediastinal silhouette and remainder of the exam is unchanged. IMPRESSION: Stable opacification over the right base and left perihilar region and medial left base. Findings may be due to atelectasis or infection. Cannot exclude a mass in the right base as recommend follow-up chest radiograph to resolution. Tubes and lines unchanged. Electronically Signed   By: Elberta Fortisaniel  Boyle M.D.   On: 11/09/2016 07:51     STUDIES:  EEG 11/22>>> Ct head 11/22>>>neg acute   CULTURES: Sputum 11/22>>> MRSA 11/22 > (-) Blood 11/22 >  ANTIBIOTICS: Zosyn 11/22>>>  SIGNIFICANT EVENTS:  11/22 admit after being found unresponsive. Hypogly. proloned down time.   LINES/TUBES: ETT 11/22>>>  DISCUSSION: 60 yo male with severe hypoglycemia and persistent AMS and acute respiratory failure with obvious aspiration.    ASSESSMENT / PLAN:  PULMONARY Acute hypoxemic respiratory failure  2/2 unable to protect airway and asp pna. Possible HCAP.  P:   abx as above  Vent support - 8cc/kg  Mental status is barrier to weaning.    CARDIOVASCULAR Hx HTN  Demand ischemia P:  Hold home cozaar, metoprolol for now - will likely need to resume  Trend BP   RENAL AKI - mild  Severe Rhabdo  P:   Hydrate  F/u cks.  GASTROINTESTINAL Elevated transaminases P:   Trend LFT's . Likely elevated LFTs today (higher). Tylenol and ASA N.  NPO for now  pepcid   HEMATOLOGIC No active issue  P:  F/u CBC  SQ heparin   INFECTIOUS Aspiration PNA - will cover nosocomial as he has been visiting his wife frequently in SNF the last month P:   Zosyn as above  Sputum culture  Trend pct  ENDOCRINE IDDM  Severe hypoglycemia -- unclear if suicide  attempt given depression s/s, pictures strewn about the home   P:   D10 IVF.  q2 for next 4-6 hrs > if stable, dec to q4.  UDS, tylenol, salicylate levels > normal.   NEUROLOGIC AMS - r/t severe hypoglycemia with unknown down time.  Last seen normal 2 days prior to admit P:   RASS goal: 0 Low dose propofol (needed for vent dysynchrony) PRN fentanyl Daily WUA  EEG c/w severe cerebral injury.  Consider MRI if no improvement next 24-48 hours  Correct  glucose as above  Check ETOH level.    FAMILY  - Updates:  Daughter updated on 11/23.   - Inter-disciplinary family meet or Palliative Care meeting due by:  11/29   Critical care time with this patient today : 30 minutes.   Pollie Meyer, MD 11/09/2016, 11:27 AM [facility] Pager [phone] After 3 pm or if no answer, call [phone]

## 2016-11-10 ENCOUNTER — Inpatient Hospital Stay (HOSPITAL_COMMUNITY): Payer: Medicare Other

## 2016-11-10 DIAGNOSIS — G931 Anoxic brain damage, not elsewhere classified: Secondary | ICD-10-CM

## 2016-11-10 LAB — BASIC METABOLIC PANEL
ANION GAP: 7 (ref 5–15)
BUN: 27 mg/dL — AB (ref 6–20)
CHLORIDE: 112 mmol/L — AB (ref 101–111)
CO2: 23 mmol/L (ref 22–32)
Calcium: 7.1 mg/dL — ABNORMAL LOW (ref 8.9–10.3)
Creatinine, Ser: 2.77 mg/dL — ABNORMAL HIGH (ref 0.61–1.24)
GFR calc Af Amer: 27 mL/min — ABNORMAL LOW (ref >60)
GFR calc non Af Amer: 23 mL/min — ABNORMAL LOW (ref >60)
GLUCOSE: 278 mg/dL — AB (ref 65–99)
POTASSIUM: 3.4 mmol/L — AB (ref 3.5–5.1)
Sodium: 142 mmol/L (ref 135–145)

## 2016-11-10 LAB — CBC
HEMATOCRIT: 37.4 % — AB (ref 39.0–52.0)
Hemoglobin: 12.2 g/dL — ABNORMAL LOW (ref 13.0–17.0)
MCH: 29.3 pg (ref 26.0–34.0)
MCHC: 32.6 g/dL (ref 30.0–36.0)
MCV: 89.7 fL (ref 78.0–100.0)
Platelets: 102 10*3/uL — ABNORMAL LOW (ref 150–400)
RBC: 4.17 MIL/uL — AB (ref 4.22–5.81)
RDW: 16 % — ABNORMAL HIGH (ref 11.5–15.5)
WBC: 11.8 10*3/uL — AB (ref 4.0–10.5)

## 2016-11-10 LAB — HEPATIC FUNCTION PANEL
ALBUMIN: 2.1 g/dL — AB (ref 3.5–5.0)
ALT: 193 U/L — ABNORMAL HIGH (ref 17–63)
AST: 463 U/L — AB (ref 15–41)
Alkaline Phosphatase: 68 U/L (ref 38–126)
Bilirubin, Direct: 0.6 mg/dL — ABNORMAL HIGH (ref 0.1–0.5)
Indirect Bilirubin: 0.5 mg/dL (ref 0.3–0.9)
TOTAL PROTEIN: 5.8 g/dL — AB (ref 6.5–8.1)
Total Bilirubin: 1.1 mg/dL (ref 0.3–1.2)

## 2016-11-10 LAB — GLUCOSE, CAPILLARY
GLUCOSE-CAPILLARY: 212 mg/dL — AB (ref 65–99)
GLUCOSE-CAPILLARY: 265 mg/dL — AB (ref 65–99)
GLUCOSE-CAPILLARY: 271 mg/dL — AB (ref 65–99)
GLUCOSE-CAPILLARY: 321 mg/dL — AB (ref 65–99)
Glucose-Capillary: 267 mg/dL — ABNORMAL HIGH (ref 65–99)
Glucose-Capillary: 260 mg/dL — ABNORMAL HIGH (ref 65–99)

## 2016-11-10 LAB — PHOSPHORUS: Phosphorus: 2.3 mg/dL — ABNORMAL LOW (ref 2.5–4.6)

## 2016-11-10 LAB — MAGNESIUM: Magnesium: 2.1 mg/dL (ref 1.7–2.4)

## 2016-11-10 LAB — CK: CK TOTAL: 27423 U/L — AB (ref 49–397)

## 2016-11-10 MED ORDER — PRO-STAT SUGAR FREE PO LIQD
60.0000 mL | Freq: Four times a day (QID) | ORAL | Status: DC
  Administered 2016-11-10 – 2016-11-24 (×49): 60 mL via ORAL
  Filled 2016-11-10 (×59): qty 60

## 2016-11-10 MED ORDER — VITAL HIGH PROTEIN PO LIQD
1000.0000 mL | ORAL | Status: DC
  Administered 2016-11-10 – 2016-11-13 (×4): 1000 mL
  Administered 2016-11-14 (×4)
  Administered 2016-11-14: 1000 mL
  Administered 2016-11-15 (×4)
  Administered 2016-11-15: 1000 mL
  Administered 2016-11-15 – 2016-11-16 (×15)
  Administered 2016-11-18 – 2016-11-23 (×6): 1000 mL
  Filled 2016-11-10 (×5): qty 1000

## 2016-11-10 MED ORDER — INSULIN ASPART 100 UNIT/ML ~~LOC~~ SOLN
2.0000 [IU] | SUBCUTANEOUS | Status: DC
  Administered 2016-11-10: 3 [IU] via SUBCUTANEOUS
  Administered 2016-11-10: 4 [IU] via SUBCUTANEOUS
  Administered 2016-11-10: 5 [IU] via SUBCUTANEOUS
  Administered 2016-11-10: 3 [IU] via SUBCUTANEOUS

## 2016-11-10 MED ORDER — LABETALOL HCL 5 MG/ML IV SOLN
10.0000 mg | INTRAVENOUS | Status: DC | PRN
  Filled 2016-11-10: qty 4

## 2016-11-10 MED ORDER — POTASSIUM CHLORIDE 20 MEQ/15ML (10%) PO SOLN
20.0000 meq | Freq: Once | ORAL | Status: AC
  Administered 2016-11-10: 20 meq via ORAL
  Filled 2016-11-10: qty 15

## 2016-11-10 NOTE — Progress Notes (Addendum)
Initial Nutrition Assessment  DOCUMENTATION CODES:   Obesity unspecified  INTERVENTION:    If unable to extubate within next 24 hours, recommend initiate TF via OGT with Vital High Protein at goal rate of 20 ml/h (480 ml per day) and Prostat 60 ml QID to provide 1280 kcals, 162 gm protein, 401 ml free water daily.  Total intake with TF + Propofol would be 1462 kcal.  NUTRITION DIAGNOSIS:   Inadequate oral intake related to inability to eat as evidenced by NPO status.  GOAL:   Provide needs based on ASPEN/SCCM guidelines  MONITOR:   Vent status, Labs, I & O's  REASON FOR ASSESSMENT:   Ventilator    ASSESSMENT:   60 yo male admitted on 11/22 with severe hypoglycemia, persistent AMS, and acute respiratory failure with obvious aspiration.   Per chart review, patient was eating poorly for the past few days. Patient is currently intubated on ventilator support MV: 11.9 L/min Temp (24hrs), Avg:100 F (37.8 C), Min:98.6 F (37 C), Max:100.9 F (38.3 C)  Propofol: 6.9 ml/hr providing 182 kcal/day.  Labs reviewed phosphorus and potassium low. Medications reviewed and include propofol.  Diet Order:  Diet NPO time specified  Skin:  Reviewed, no issues  Last BM:  11/22  Height:   Ht Readings from Last 1 Encounters:  11/08/16 6' (1.829 m)    Weight:   Wt Readings from Last 1 Encounters:  11/10/16 247 lb 12.8 oz (112.4 kg)    Ideal Body Weight:  80.9 kg  BMI:  Body mass index is 33.61 kg/m.  Estimated Nutritional Needs:   Kcal:  [id number]  Protein:  162 gm  Fluid:  2 L  EDUCATION NEEDS:   No education needs identified at this time  Joaquin CourtsKimberly Harris, RD, LDN, CNSC Pager [phone] After Hours Pager [phone]

## 2016-11-10 NOTE — Progress Notes (Signed)
eLink Physician-Brief Progress Note Patient Name: Andrew [name] NeedsJohnny R Smoot DOB: 1956/11/04 MRN: [id number]   Date of Service  11/10/2016  HPI/Events of Note  Hyperglycemia with BS in the 200s on D10.  eICU Interventions  Plan: Reduce D10 infusion to 50 cc/hr     Intervention Category Intermediate Interventions: Hyperglycemia - evaluation and treatment  DETERDING,ELIZABETH 11/10/2016, 1:26 AM

## 2016-11-10 NOTE — Progress Notes (Signed)
PULMONARY / CRITICAL CARE MEDICINE   Name: Andrew [name] MRN: [id number] DOB: [birth year]    ADMISSION DATE:  11/08/2016  REFERRING MD:  EDP   CHIEF COMPLAINT:  AMS, respiratory failure, aspiration   HISTORY OF PRESENT ILLNESS:   60 yo male with hx IDDM, GERD, HTN, OSA presented 11/22 after being found unresponsive at home for unknown period of time.  He lives with his wife but she has been in SNF for the last month.  Was last seen normal 2 days prior although family is trying to determine if his home health aide saw him in the interim.  Was found to have glucose 21, no improvement in mental status with D50.  He was bagged by EMS en route and had multiple episodes of vomiting with obvious aspiration.  Intubated on arrival to ER and PCCM called to admit.  Of note, daughter concerned that he has been very depressed the last few weeks.  There were family pictures strewn about the house when family arrived.  He has had little to no PO intake but continues to take his insulin per family.       SUBJECTIVE:  Remains critically ill.  Not on pressors.  No purposeful mm. On propofol for vent dysynchrony.  CBG better.    VITAL SIGNS: BP (!) 154/78   Pulse (!) 103   Temp 99.5 F (37.5 C) (Oral)   Resp 18   Ht 6' (1.829 m)   Wt 112.4 kg (247 lb 12.8 oz)   SpO2 98%   BMI 33.61 kg/m   HEMODYNAMICS:    VENTILATOR SETTINGS: Vent Mode: PRVC FiO2 (%):  [30 %-100 %] 30 % Set Rate:  [18 bmp] 18 bmp Vt Set:  [620 mL] 620 mL PEEP:  [5 cmH20] 5 cmH20 Plateau Pressure:  [22 cmH20-26 cmH20] 24 cmH20  INTAKE / OUTPUT: I/O last 3 completed shifts: In: 5573.3 [I.V.:3839.3; NG/GT:30; IV Piggyback:1704] Out: 2850 [Urine:2850]  PHYSICAL EXAMINATION: General:  Chronically ill appearing male, unresponsive  Neuro:  Unresponsive on low dose propofol. CN difficult to assess 2/2 propofol. Breathes over vent. (-) purposeful mm HEENT:  Mm dry, ETT  Cardiovascular:  s1s2 rrr Lungs:  resps even non  labored on vent, scattered rhonchi  Abdomen:  Round, soft, hypoactive bs  Musculoskeletal:  Warm and dry, no edema, venous stasis   LABS:  BMET  Recent Labs Lab 11/09/16 0236 11/09/16 0602 11/10/16 0420  NA 146* 147* 142  K 4.0 4.6 3.4*  CL 118* 120* 112*  CO2 17* 17* 23  BUN 27* 28* 27*  CREATININE 2.38* 2.42* 2.77*  GLUCOSE 115* 90 278*    Electrolytes  Recent Labs Lab 11/08/16 1330  11/09/16 0236 11/09/16 0602 11/10/16 0420  CALCIUM 8.1*  < > 7.2* 7.1* 7.1*  MG 2.0  --   --   --  2.1  PHOS 4.1  --   --   --  2.3*  < > = values in this interval not displayed.  CBC  Recent Labs Lab 11/08/16 1330 11/09/16 0602 11/10/16 0420  WBC 16.4* 11.1* 11.8*  HGB 14.5 15.8 12.2*  HCT 45.0 50.2 37.4*  PLT 116* 75* 102*    Coag's No results for input(s): APTT, INR in the last 168 hours.  Sepsis Markers  Recent Labs Lab 11/08/16 1330  11/08/16 1341 11/08/16 1656 11/08/16 2338  LATICACIDVEN  --   < > 3.91* 3.8* 3.9*  PROCALCITON 7.39  --   --   --   --   < > =  values in this interval not displayed.  ABG  Recent Labs Lab 11/08/16 1146 11/09/16 0549  PHART 7.404 7.431  PCO2ART 37.2 28.8*  PO2ART 192.0* 78.1*    Liver Enzymes  Recent Labs Lab 11/08/16 1021 11/10/16 0420  AST 218* 463*  ALT 92* 193*  ALKPHOS 80 68  BILITOT 1.1 1.1  ALBUMIN 3.4* 2.1*    Cardiac Enzymes No results for input(s): TROPONINI, PROBNP in the last 168 hours.  Glucose  Recent Labs Lab 11/09/16 1209 11/09/16 1612 11/09/16 2036 11/10/16 0008 11/10/16 0433 11/10/16 0757  GLUCAP 160* 164* 263* 271* 212* 267*    Imaging Dg Chest Port 1 View  Result Date: 11/10/2016 CLINICAL DATA:  Respiratory failure. EXAM: PORTABLE CHEST 1 VIEW COMPARISON:  11/09/2016. FINDINGS: Endotracheal tube, NG tube in stable position. Right PICC line noted with tip projected over the right atrium. Heart size stable. Bilateral bilateral pulmonary infiltrates again noted. Right base  pulmonary mass lesion again cannot be excluded and continued follow-up exam suggested to demonstrate clearing . No pleural effusion or pneumothorax. IMPRESSION: 1. Right PICC line noted with tip projected over the right atrium. Endotracheal tube and NG tube in stable position. 2. Bibasilar pulmonary infiltrates again noted. Right base pulmonary mass lesion again cannot be excluded continued follow-up chest x-rays to demonstrate clearing suggested . Electronically Signed   By: Maisie Fushomas  Register   On: 11/10/2016 07:08     STUDIES:  EEG 11/22>>> Ct head 11/22>>>neg acute   CULTURES: Sputum 11/22>>> (-) MRSA 11/22 > (-) Blood 11/22 > (-)  ANTIBIOTICS: Zosyn 11/22>>>  SIGNIFICANT EVENTS:  11/22 admit after being found unresponsive. Hypogly. proloned down time.   LINES/TUBES: ETT 11/22>>>  DISCUSSION: 60 yo male with severe hypoglycemia and persistent AMS and acute respiratory failure with obvious aspiration.    ASSESSMENT / PLAN:  PULMONARY Acute hypoxemic respiratory failure  2/2 unable to protect airway and asp pna. Possible HCAP.  P:   abx as above  Vent support - 8cc/kg  Mental status is barrier to weaning.    CARDIOVASCULAR Hx HTN  Demand ischemia P:  Hold home cozaar, metoprolol for now - will likely need to resume  Trend BP   RENAL AKI - mild  Severe Rhabdo  P:   Will start TF and d/c IVF F/u CK total  GASTROINTESTINAL Elevated transaminases P:   Trend LFT's . Likely elevated LFTs today (higher). Tylenol and ASA N.  TF pepcid   HEMATOLOGIC No active issue  P:  F/u CBC  SQ heparin   INFECTIOUS Aspiration PNA - will cover nosocomial as he has been visiting his wife frequently in SNF the last month P:   Zosyn as above  Sputum culture  Trend pct  ENDOCRINE IDDM  Severe hypoglycemia -- unclear if suicide attempt given depression s/s, pictures strewn about the home . CBG better.  P:   Start TF and CBG coverage but very conservative (more than 200  mg%)  NEUROLOGIC AMS - r/t severe hypoglycemia with unknown down time.  Last seen normal 2 days prior to admit P:   RASS goal: 0 Low dose propofol (needed for vent dysynchrony) > try to wean PRN fentanyl Daily WUA  EEG c/w severe cerebral injury.  Consider MRI if no improvement next 24-48 hours  Correct glucose as above    FAMILY  - Updates:  Daughter updated on 11/23.   - Inter-disciplinary family meet or Palliative Care meeting due by:  11/29   Critical care time with this patient  today : 30 minutes.   Pollie Meyer, MD 11/10/2016, 10:43 AM [facility] Pager [phone] After 3 pm or if no answer, call [phone]

## 2016-11-10 NOTE — Progress Notes (Signed)
[facility] PCCM  Updated wife of over all prognosis.  She wants to see how he does by Monday and discuss code status again. She will call his family to see him.  Pollie MeyerJ. Angelo A de Dios, MD 11/10/2016, 12:40 PM [facility] Pager [phone] After 3 pm or if no answer, call [phone]

## 2016-11-10 NOTE — Progress Notes (Signed)
Inpatient Diabetes Program Recommendations  AACE/ADA: New Consensus Statement on Inpatient Glycemic Control (2015)  Target Ranges:  Prepandial:   less than 140 mg/dL      Peak postprandial:   less than 180 mg/dL (1-2 hours)      Critically ill patients:  140 - 180 mg/dL   Note that patient admitted with severe hypoglycemia.  Diabetes history: Type 1 diabetes per MD note Outpatient Diabetes medications:  Lantus 200 units daily Current orders for Inpatient glycemic control:  None-D10 at 50 cc/hr  Inpatient Diabetes Program Recommendations:    Note that patient was on very high dose of basal insulin once daily prior to admit, which was prescribed by his outpatient endocrinologist.  Will follow.  Thanks, Beryl MeagerJenny Deisy Ozbun, RN, BC-ADM Inpatient Diabetes Coordinator Pager [phone] (8a-5p)

## 2016-11-11 ENCOUNTER — Inpatient Hospital Stay (HOSPITAL_COMMUNITY): Payer: Medicare Other

## 2016-11-11 DIAGNOSIS — J9601 Acute respiratory failure with hypoxia: Secondary | ICD-10-CM

## 2016-11-11 LAB — GLUCOSE, CAPILLARY
GLUCOSE-CAPILLARY: 340 mg/dL — AB (ref 65–99)
GLUCOSE-CAPILLARY: 290 mg/dL — AB (ref 65–99)
Glucose-Capillary: 390 mg/dL — ABNORMAL HIGH (ref 65–99)
Glucose-Capillary: 393 mg/dL — ABNORMAL HIGH (ref 65–99)
Glucose-Capillary: 328 mg/dL — ABNORMAL HIGH (ref 65–99)
Glucose-Capillary: 378 mg/dL — ABNORMAL HIGH (ref 65–99)

## 2016-11-11 LAB — BASIC METABOLIC PANEL
ANION GAP: 8 (ref 5–15)
BUN: 41 mg/dL — ABNORMAL HIGH (ref 6–20)
CALCIUM: 7.7 mg/dL — AB (ref 8.9–10.3)
CO2: 23 mmol/L (ref 22–32)
CREATININE: 2.54 mg/dL — AB (ref 0.61–1.24)
Chloride: 113 mmol/L — ABNORMAL HIGH (ref 101–111)
GFR calc Af Amer: 30 mL/min — ABNORMAL LOW (ref >60)
GFR, EST NON AFRICAN AMERICAN: 26 mL/min — AB (ref >60)
GLUCOSE: 393 mg/dL — AB (ref 65–99)
Potassium: 3.7 mmol/L (ref 3.5–5.1)
Sodium: 144 mmol/L (ref 135–145)

## 2016-11-11 LAB — CBC
HEMATOCRIT: 36.6 % — AB (ref 39.0–52.0)
HEMOGLOBIN: 11.8 g/dL — AB (ref 13.0–17.0)
MCH: 28.9 pg (ref 26.0–34.0)
MCHC: 32.2 g/dL (ref 30.0–36.0)
MCV: 89.5 fL (ref 78.0–100.0)
Platelets: 107 10*3/uL — ABNORMAL LOW (ref 150–400)
RBC: 4.09 MIL/uL — AB (ref 4.22–5.81)
RDW: 15.9 % — ABNORMAL HIGH (ref 11.5–15.5)
WBC: 12.4 10*3/uL — ABNORMAL HIGH (ref 4.0–10.5)

## 2016-11-11 LAB — HEPATIC FUNCTION PANEL
ALK PHOS: 89 U/L (ref 38–126)
ALT: 204 U/L — AB (ref 17–63)
AST: 337 U/L — ABNORMAL HIGH (ref 15–41)
Albumin: 2.1 g/dL — ABNORMAL LOW (ref 3.5–5.0)
BILIRUBIN DIRECT: 0.9 mg/dL — AB (ref 0.1–0.5)
BILIRUBIN INDIRECT: 0.9 mg/dL (ref 0.3–0.9)
BILIRUBIN TOTAL: 1.8 mg/dL — AB (ref 0.3–1.2)
Total Protein: 6.1 g/dL — ABNORMAL LOW (ref 6.5–8.1)

## 2016-11-11 LAB — PHOSPHORUS: Phosphorus: 2.4 mg/dL — ABNORMAL LOW (ref 2.5–4.6)

## 2016-11-11 LAB — CK: CK TOTAL: 19282 U/L — AB (ref 49–397)

## 2016-11-11 LAB — MAGNESIUM: Magnesium: 2.2 mg/dL (ref 1.7–2.4)

## 2016-11-11 MED ORDER — INSULIN ASPART 100 UNIT/ML ~~LOC~~ SOLN
4.0000 [IU] | SUBCUTANEOUS | Status: DC
  Administered 2016-11-11 – 2016-11-15 (×26): 4 [IU] via SUBCUTANEOUS

## 2016-11-11 MED ORDER — INSULIN ASPART 100 UNIT/ML ~~LOC~~ SOLN
0.0000 [IU] | SUBCUTANEOUS | Status: DC
  Administered 2016-11-11: 7 [IU] via SUBCUTANEOUS
  Administered 2016-11-11: 9 [IU] via SUBCUTANEOUS
  Administered 2016-11-11: 5 [IU] via SUBCUTANEOUS
  Administered 2016-11-11: 7 [IU] via SUBCUTANEOUS
  Administered 2016-11-11 – 2016-11-12 (×2): 9 [IU] via SUBCUTANEOUS
  Administered 2016-11-12 (×3): 5 [IU] via SUBCUTANEOUS
  Administered 2016-11-12 – 2016-11-13 (×5): 7 [IU] via SUBCUTANEOUS
  Administered 2016-11-13: 4 [IU] via SUBCUTANEOUS
  Administered 2016-11-13: 7 [IU] via SUBCUTANEOUS
  Administered 2016-11-13 (×2): 5 [IU] via SUBCUTANEOUS
  Administered 2016-11-14: 7 [IU] via SUBCUTANEOUS
  Administered 2016-11-14 (×3): 5 [IU] via SUBCUTANEOUS
  Administered 2016-11-14: 7 [IU] via SUBCUTANEOUS
  Administered 2016-11-15: 6 [IU] via SUBCUTANEOUS
  Administered 2016-11-15 (×3): 9 [IU] via SUBCUTANEOUS

## 2016-11-11 NOTE — Progress Notes (Signed)
PULMONARY / CRITICAL CARE MEDICINE   Name: Andrew [name] MRN: [id number] DOB: 01-07-1956    ADMISSION DATE:  11/08/2016  REFERRING MD:  EDP   CHIEF COMPLAINT:  AMS, respiratory failure, aspiration   HISTORY OF PRESENT ILLNESS:   60 yo male with hx IDDM, GERD, HTN, OSA presented 11/22 after being found unresponsive at home for unknown period of time.  He lives with his wife but she has been in SNF for the last month.  Was last seen normal 2 days prior although family is trying to determine if his home health aide saw him in the interim.  Was found to have glucose 21, no improvement in mental status with D50.  He was bagged by EMS en route and had multiple episodes of vomiting with obvious aspiration.  Intubated on arrival to ER and PCCM called to admit.  Of note, daughter concerned that he has been very depressed the last few weeks.  There were family pictures strewn about the house when family arrived.  He has had little to no PO intake but continues to take his insulin per family.       SUBJECTIVE:  Remains critically ill.  Not on pressors.  No purposeful mm. Off propofol x 24 hrs.  CBGs elevated now.    VITAL SIGNS: BP (!) 153/80   Pulse (!) 120   Temp 99.6 F (37.6 C) (Oral)   Resp (!) 28   Ht 6' (1.829 m)   Wt 110.1 kg (242 lb 11.6 oz)   SpO2 96%   BMI 32.92 kg/m   HEMODYNAMICS:    VENTILATOR SETTINGS: Vent Mode: CPAP;PSV FiO2 (%):  [30 %] 30 % Set Rate:  [18 bmp] 18 bmp Vt Set:  [620 mL] 620 mL PEEP:  [5 cmH20] 5 cmH20 Pressure Support:  [12 cmH20] 12 cmH20 Plateau Pressure:  [22 cmH20-28 cmH20] 22 cmH20  INTAKE / OUTPUT: I/O last 3 completed shifts: In: 2779.2 [I.V.:1413.7; Other:150; NG/GT:818; IV Piggyback:397.5] Out: 3700 [Urine:3700]  PHYSICAL EXAMINATION: General:  Chronically ill appearing male, unresponsive. Eye opening but not purposeful.  Neuro:  CN unremarkable.eye opening but not purposeful. Breathes over vent. (-) purposeful mm HEENT:  Mm  dry, ETT  Cardiovascular:  s1s2 rrr Lungs:  resps even non labored on vent, scattered rhonchi  Abdomen:  Round, soft, hypoactive bs  Musculoskeletal:  Warm and dry, no edema, venous stasis   LABS:  BMET  Recent Labs Lab 11/09/16 0602 11/10/16 0420 11/11/16 0445  NA 147* 142 144  K 4.6 3.4* 3.7  CL 120* 112* 113*  CO2 17* 23 23  BUN 28* 27* 41*  CREATININE 2.42* 2.77* 2.54*  GLUCOSE 90 278* 393*    Electrolytes  Recent Labs Lab 11/08/16 1330  11/09/16 0602 11/10/16 0420 11/11/16 0445  CALCIUM 8.1*  < > 7.1* 7.1* 7.7*  MG 2.0  --   --  2.1 2.2  PHOS 4.1  --   --  2.3* 2.4*  < > = values in this interval not displayed.  CBC  Recent Labs Lab 11/09/16 0602 11/10/16 0420 11/11/16 0445  WBC 11.1* 11.8* 12.4*  HGB 15.8 12.2* 11.8*  HCT 50.2 37.4* 36.6*  PLT 75* 102* 107*    Coag's No results for input(s): APTT, INR in the last 168 hours.  Sepsis Markers  Recent Labs Lab 11/08/16 1330  11/08/16 1341 11/08/16 1656 11/08/16 2338  LATICACIDVEN  --   < > 3.91* 3.8* 3.9*  PROCALCITON 7.39  --   --   --   --   < > =  values in this interval not displayed.  ABG  Recent Labs Lab 11/08/16 1146 11/09/16 0549  PHART 7.404 7.431  PCO2ART 37.2 28.8*  PO2ART 192.0* 78.1*    Liver Enzymes  Recent Labs Lab 11/08/16 1021 11/10/16 0420 11/11/16 0445  AST 218* 463* 337*  ALT 92* 193* 204*  ALKPHOS 80 68 89  BILITOT 1.1 1.1 1.8*  ALBUMIN 3.4* 2.1* 2.1*    Cardiac Enzymes No results for input(s): TROPONINI, PROBNP in the last 168 hours.  Glucose  Recent Labs Lab 11/10/16 1557 11/10/16 1957 11/10/16 2325 11/11/16 0349 11/11/16 0839 11/11/16 1229  GLUCAP 265* 260* 390* 393* 340* 328*    Imaging Dg Chest Port 1 View  Result Date: 11/11/2016 CLINICAL DATA:  Acute respiratory failure EXAM: PORTABLE CHEST 1 VIEW COMPARISON:  11/10/2016 FINDINGS: Cardiac shadow remains enlarged. Endotracheal tube and nasogastric catheter are again seen. The  overall inspiratory effort is poor with bibasilar atelectatic changes right-sided PICC line is again noted and stable. No bony abnormality is seen. IMPRESSION: Stable bibasilar changes.  No new abnormality is seen. Electronically Signed   By: Alcide CleverMark  Lukens M.D.   On: 11/11/2016 07:16     STUDIES:  EEG 11/22>>> Ct head 11/22>>>neg acute   CULTURES: Sputum 11/22>>> (-) MRSA 11/22 > (-) Blood 11/22 > (-)  ANTIBIOTICS: Zosyn 11/22>>>  SIGNIFICANT EVENTS:  11/22 admit after being found unresponsive. Hypogly. proloned down time.   LINES/TUBES: ETT 11/22>>>  DISCUSSION: 60 yo male with severe hypoglycemia and persistent AMS and acute respiratory failure with obvious aspiration.    ASSESSMENT / PLAN:  PULMONARY Acute hypoxemic respiratory failure  2/2 unable to protect airway and asp pna. Possible HCAP.  P:   abx as above  Vent support - 8cc/kg  Mental status is barrier to weaning.    CARDIOVASCULAR Hx HTN  Demand ischemia P:  Hold home cozaar, metoprolol for now - will likely need to resume  Trend BP   RENAL AKI - mild  Severe Rhabdo  P:   Cont  TF  F/u CK total.   GASTROINTESTINAL Elevated transaminases P:   Trend LFT's  TF pepcid   HEMATOLOGIC No active issue  P:  F/u CBC  SQ heparin   INFECTIOUS Aspiration PNA - will cover nosocomial as he has been visiting his wife frequently in SNF the last month P:   Zosyn as above  Sputum culture  Trend pct  ENDOCRINE IDDM  Severe hypoglycemia -- unclear if suicide attempt given depression s/s, pictures strewn about the home . CBG better.  P:   cont TF and CBG coverage. Will add insulin q4 with TF  NEUROLOGIC AMS - r/t severe hypoglycemia with unknown down time.  Last seen normal 2 days prior to admit P:   RASS goal: 0 Off propofol  11/24 PRN fentanyl Daily WUA when able EEG c/w severe cerebral injury.  Consider MRI if no improvement next 24-48 hours  Correct glucose as above    FAMILY  - Updates:   Daughter updated on 11/23.   - Inter-disciplinary family meet or Palliative Care meeting due by:  11/29   Critical care time with this patient today : 30 minutes.   Pollie MeyerJ. Angelo A de Dios, MD 11/11/2016, 1:51 PM [facility] Pager [phone] After 3 pm or if no answer, call [phone]

## 2016-11-11 NOTE — Progress Notes (Signed)
Pharmacy Antibiotic Note  Andrew [name] is a 60 y.o. male admitted on 11/08/2016 with pneumonia.  Pharmacy has been consulted for zosyn and vancomycin dosing.  Patient came to ED unresponsive and vomiting. Vancomycin was stopped due to covering possible nosocomial aspiration pneumonia.  Plan: Continue zosyn 3.375g IV q8h Monitor culture data, renal function and clinical course   Height: 6' (182.9 cm) Weight: 242 lb 11.6 oz (110.1 kg) IBW/kg (Calculated) : 77.6  Temp (24hrs), Avg:100.2 F (37.9 C), Min:99.5 F (37.5 C), Max:100.6 F (38.1 C)   Recent Labs Lab 11/08/16 1021 11/08/16 1050 11/08/16 1330 11/08/16 1337 11/08/16 1341 11/08/16 1656  11/08/16 2338 11/08/16 2351 11/09/16 0236 11/09/16 0602 11/10/16 0420 11/11/16 0445  WBC 21.8*  --  16.4*  --   --   --   --   --   --   --  11.1* 11.8* 12.4*  CREATININE 1.97* 2.10* 1.71*  1.71*  --   --   --   < >  --  2.30* 2.38* 2.42* 2.77* 2.54*  LATICACIDVEN  --  4.13*  --  3.6* 3.91* 3.8*  --  3.9*  --   --   --   --   --   < > = values in this interval not displayed.  Estimated Creatinine Clearance: 40.1 mL/min (by C-G formula based on SCr of 2.54 mg/dL (H)).    Allergies  Allergen Reactions  . Lisinopril Cough    cough    Arlean Hoppingorey M. Newman PiesBall, PharmD, BCPS Clinical Pharmacist Pager [phone] 11/11/2016 11:17 AM

## 2016-11-11 NOTE — Progress Notes (Signed)
eLink Physician-Brief Progress Note Patient Name: Andrew [name] NeedsJohnny R Hable DOB: October 05, 1956 MRN: [id number]   Date of Service  11/11/2016  HPI/Events of Note  Hyperglycemia  eICU Interventions  SSI q4 hours - sensitive scale given history of hypoglycemia     Intervention Category Intermediate Interventions: Hyperglycemia - evaluation and treatment  Jaylyne Breese 11/11/2016, 4:29 AM

## 2016-11-12 LAB — GLUCOSE, CAPILLARY
GLUCOSE-CAPILLARY: 282 mg/dL — AB (ref 65–99)
GLUCOSE-CAPILLARY: 325 mg/dL — AB (ref 65–99)
GLUCOSE-CAPILLARY: 333 mg/dL — AB (ref 65–99)
Glucose-Capillary: 261 mg/dL — ABNORMAL HIGH (ref 65–99)
Glucose-Capillary: 373 mg/dL — ABNORMAL HIGH (ref 65–99)
Glucose-Capillary: 278 mg/dL — ABNORMAL HIGH (ref 65–99)
Glucose-Capillary: 350 mg/dL — ABNORMAL HIGH (ref 65–99)

## 2016-11-12 LAB — CBC
HCT: 37.5 % — ABNORMAL LOW (ref 39.0–52.0)
HEMOGLOBIN: 12.1 g/dL — AB (ref 13.0–17.0)
MCH: 28.9 pg (ref 26.0–34.0)
MCHC: 32.3 g/dL (ref 30.0–36.0)
MCV: 89.7 fL (ref 78.0–100.0)
PLATELETS: 124 10*3/uL — AB (ref 150–400)
RBC: 4.18 MIL/uL — ABNORMAL LOW (ref 4.22–5.81)
RDW: 15.8 % — ABNORMAL HIGH (ref 11.5–15.5)
WBC: 13 10*3/uL — ABNORMAL HIGH (ref 4.0–10.5)

## 2016-11-12 LAB — BASIC METABOLIC PANEL
Anion gap: 7 (ref 5–15)
BUN: 56 mg/dL — AB (ref 6–20)
CHLORIDE: 120 mmol/L — AB (ref 101–111)
CO2: 25 mmol/L (ref 22–32)
CREATININE: 2.38 mg/dL — AB (ref 0.61–1.24)
Calcium: 8.4 mg/dL — ABNORMAL LOW (ref 8.9–10.3)
GFR calc Af Amer: 33 mL/min — ABNORMAL LOW (ref >60)
GFR calc non Af Amer: 28 mL/min — ABNORMAL LOW (ref >60)
Glucose, Bld: 300 mg/dL — ABNORMAL HIGH (ref 65–99)
Potassium: 3.3 mmol/L — ABNORMAL LOW (ref 3.5–5.1)
SODIUM: 152 mmol/L — AB (ref 135–145)

## 2016-11-12 LAB — PHOSPHORUS: Phosphorus: 2.4 mg/dL — ABNORMAL LOW (ref 2.5–4.6)

## 2016-11-12 LAB — HEPATIC FUNCTION PANEL
ALBUMIN: 2 g/dL — AB (ref 3.5–5.0)
ALK PHOS: 113 U/L (ref 38–126)
ALT: 190 U/L — AB (ref 17–63)
AST: 217 U/L — ABNORMAL HIGH (ref 15–41)
BILIRUBIN TOTAL: 1 mg/dL (ref 0.3–1.2)
Bilirubin, Direct: 0.4 mg/dL (ref 0.1–0.5)
Indirect Bilirubin: 0.6 mg/dL (ref 0.3–0.9)
Total Protein: 6.3 g/dL — ABNORMAL LOW (ref 6.5–8.1)

## 2016-11-12 LAB — MAGNESIUM: MAGNESIUM: 2.8 mg/dL — AB (ref 1.7–2.4)

## 2016-11-12 LAB — CK: Total CK: 9901 U/L — ABNORMAL HIGH (ref 49–397)

## 2016-11-12 MED ORDER — POTASSIUM CHLORIDE 20 MEQ/15ML (10%) PO SOLN
40.0000 meq | Freq: Once | ORAL | Status: AC
  Administered 2016-11-12: 40 meq
  Filled 2016-11-12: qty 30

## 2016-11-12 MED ORDER — FREE WATER
200.0000 mL | Freq: Four times a day (QID) | Status: DC
Start: 2016-11-12 — End: 2016-11-13
  Administered 2016-11-12 – 2016-11-13 (×5): 200 mL

## 2016-11-12 MED ORDER — METOPROLOL TARTRATE 5 MG/5ML IV SOLN
5.0000 mg | INTRAVENOUS | Status: DC | PRN
Start: 2016-11-12 — End: 2016-11-24
  Administered 2016-11-12 – 2016-11-20 (×5): 5 mg via INTRAVENOUS
  Filled 2016-11-12 (×6): qty 5

## 2016-11-12 NOTE — Progress Notes (Signed)
eLink Physician-Brief Progress Note Patient Name: Andrew NeedsJohnny R [name] DOB: August 14, 1956 MRN: [id number]   Date of Service  11/12/2016  HPI/Events of Note  Hypokalemia  eICU Interventions  Potassium replaced     Intervention Category Minor Interventions: Electrolytes abnormality - evaluation and management  DETERDING,ELIZABETH 11/12/2016, 5:40 AM

## 2016-11-12 NOTE — Progress Notes (Signed)
PULMONARY / CRITICAL CARE MEDICINE   Name: Andrew [name] MRN: [id number] DOB: December 15, 1956    ADMISSION DATE:  11/08/2016  REFERRING MD:  EDP   CHIEF COMPLAINT:  AMS, respiratory failure, aspiration   HISTORY OF PRESENT ILLNESS:   60 yo male with hx IDDM, GERD, HTN, OSA presented 11/22 after being found unresponsive at home for unknown period of time.  He lives with his wife but she has been in SNF for the last month.  Was last seen normal 2 days prior although family is trying to determine if his home health aide saw him in the interim.  Was found to have glucose 21, no improvement in mental status with D50.  He was bagged by EMS en route and had multiple episodes of vomiting with obvious aspiration.  Intubated on arrival to ER and PCCM called to admit.  Of note, daughter concerned that he has been very depressed the last few weeks.  There were family pictures strewn about the house when family arrived.  He has had little to no PO intake but continues to take his insulin per family.       SUBJECTIVE:  Remains critically ill.  Not on pressors.  No purposeful mm. Off propofol x 48 hrs.     VITAL SIGNS: BP (!) 173/94   Pulse (!) 112   Temp 99.5 F (37.5 C) (Oral)   Resp (!) 25   Ht 6' (1.829 m)   Wt 107.6 kg (237 lb 3.4 oz)   SpO2 96%   BMI 32.17 kg/m   HEMODYNAMICS:    VENTILATOR SETTINGS: Vent Mode: CPAP;PSV FiO2 (%):  [30 %] 30 % Set Rate:  [18 bmp] 18 bmp Vt Set:  [620 mL] 620 mL PEEP:  [5 cmH20] 5 cmH20 Pressure Support:  [12 cmH20] 12 cmH20 Plateau Pressure:  [23 cmH20] 23 cmH20  INTAKE / OUTPUT: I/O last 3 completed shifts: In: 2547.5 [I.V.:180; NG/GT:2055; IV Piggyback:312.5] Out: 3950 [Urine:3650; Stool:300]  PHYSICAL EXAMINATION: General:  Chronically ill appearing male, unresponsive. Eye opening but not purposeful.  Neuro:  CN unremarkable.eye opening but not purposeful. Breathes over vent. (-) purposeful mm HEENT:  Mm dry, ETT  Cardiovascular:  s1s2  rrr Lungs:  resps even non labored on vent, scattered rhonchi  Abdomen:  Round, soft, hypoactive bs  Musculoskeletal:  Warm and dry, no edema, venous stasis   LABS:  BMET  Recent Labs Lab 11/10/16 0420 11/11/16 0445 11/12/16 0355  NA 142 144 152*  K 3.4* 3.7 3.3*  CL 112* 113* 120*  CO2 23 23 25   BUN 27* 41* 56*  CREATININE 2.77* 2.54* 2.38*  GLUCOSE 278* 393* 300*    Electrolytes  Recent Labs Lab 11/10/16 0420 11/11/16 0445 11/12/16 0355  CALCIUM 7.1* 7.7* 8.4*  MG 2.1 2.2 2.8*  PHOS 2.3* 2.4* 2.4*    CBC  Recent Labs Lab 11/10/16 0420 11/11/16 0445 11/12/16 0355  WBC 11.8* 12.4* 13.0*  HGB 12.2* 11.8* 12.1*  HCT 37.4* 36.6* 37.5*  PLT 102* 107* 124*    Coag's No results for input(s): APTT, INR in the last 168 hours.  Sepsis Markers  Recent Labs Lab 11/08/16 1330  11/08/16 1341 11/08/16 1656 11/08/16 2338  LATICACIDVEN  --   < > 3.91* 3.8* 3.9*  PROCALCITON 7.39  --   --   --   --   < > = values in this interval not displayed.  ABG  Recent Labs Lab 11/08/16 1146 11/09/16 0549  PHART 7.404 7.431  PCO2ART 37.2 28.8*  PO2ART 192.0* 78.1*    Liver Enzymes  Recent Labs Lab 11/10/16 0420 11/11/16 0445 11/12/16 0355  AST 463* 337* 217*  ALT 193* 204* 190*  ALKPHOS 68 89 113  BILITOT 1.1 1.8* 1.0  ALBUMIN 2.1* 2.1* 2.0*    Cardiac Enzymes No results for input(s): TROPONINI, PROBNP in the last 168 hours.  Glucose  Recent Labs Lab 11/11/16 2002 11/12/16 0003 11/12/16 0457 11/12/16 0819 11/12/16 1147 11/12/16 1619  GLUCAP 290* 282* 261* 325* 373* 278*    Imaging No results found.   STUDIES:  EEG 11/22>>> Ct head 11/22>>>neg acute   CULTURES: Sputum 11/22>>> (-) MRSA 11/22 > (-) Blood 11/22 > (-)  ANTIBIOTICS: Zosyn 11/22>>>  SIGNIFICANT EVENTS:  11/22 admit after being found unresponsive. Hypogly. proloned down time.   LINES/TUBES: ETT 11/22>>>  DISCUSSION: 60 yo male with severe hypoglycemia and  persistent AMS and acute respiratory failure with obvious aspiration.    ASSESSMENT / PLAN:  PULMONARY Acute hypoxemic respiratory failure  2/2 unable to protect airway and asp pna. Possible HCAP.  P:   abx as above  Vent support - 8cc/kg  Mental status is barrier to weaning.    CARDIOVASCULAR Hx HTN  Demand ischemia P:  Hold home cozaar, metoprolol for now - will likely need to resume  Trend BP   RENAL AKI - mild  Severe Rhabdo  P:   Cont  TF  F/u CK total.   GASTROINTESTINAL Elevated transaminases P:   Trend LFT's  TF pepcid   HEMATOLOGIC No active issue  P:  F/u CBC  SQ heparin   INFECTIOUS Aspiration PNA - will cover nosocomial as he has been visiting his wife frequently in SNF the last month P:   Zosyn as above  Sputum culture  Trend pct  ENDOCRINE IDDM  Severe hypoglycemia -- unclear if suicide attempt given depression s/s, pictures strewn about the home . CBG better.  P:   cont TF and CBG coverage. Will add insulin q4 with TF  NEUROLOGIC AMS - r/t severe hypoglycemia with unknown down time.  Last seen normal 2 days prior to admit P:   RASS goal: 0 Off propofol  11/24 PRN fentanyl Daily WUA when able EEG c/w severe cerebral injury.  Consider MRI if no improvement next 24-48 hours     FAMILY  - Updates:  Daughter updated on 11/23. Spoke to wife 11/25.   - Inter-disciplinary family meet or Palliative Care meeting due by:  11/29   Critical care time with this patient today : 30 minutes.   Pollie MeyerJ. Angelo A de Dios, MD 11/12/2016, 7:40 PM [facility] Pager [phone] After 3 pm or if no answer, call [phone]

## 2016-11-13 ENCOUNTER — Inpatient Hospital Stay (HOSPITAL_COMMUNITY): Payer: Medicare Other

## 2016-11-13 LAB — CBC
HCT: 39.4 % (ref 39.0–52.0)
Hemoglobin: 12.6 g/dL — ABNORMAL LOW (ref 13.0–17.0)
MCH: 29.2 pg (ref 26.0–34.0)
MCHC: 32 g/dL (ref 30.0–36.0)
MCV: 91.2 fL (ref 78.0–100.0)
PLATELETS: 123 10*3/uL — AB (ref 150–400)
RBC: 4.32 MIL/uL (ref 4.22–5.81)
RDW: 16.1 % — AB (ref 11.5–15.5)
WBC: 13.1 10*3/uL — ABNORMAL HIGH (ref 4.0–10.5)

## 2016-11-13 LAB — BASIC METABOLIC PANEL
Anion gap: 9 (ref 5–15)
Anion gap: 8 (ref 5–15)
BUN: 71 mg/dL — AB (ref 6–20)
BUN: 75 mg/dL — ABNORMAL HIGH (ref 6–20)
CALCIUM: 9.1 mg/dL (ref 8.9–10.3)
CHLORIDE: 126 mmol/L — AB (ref 101–111)
CO2: 23 mmol/L (ref 22–32)
CO2: 23 mmol/L (ref 22–32)
CREATININE: 2.36 mg/dL — AB (ref 0.61–1.24)
Calcium: 9.1 mg/dL (ref 8.9–10.3)
Chloride: 123 mmol/L — ABNORMAL HIGH (ref 101–111)
Creatinine, Ser: 2.65 mg/dL — ABNORMAL HIGH (ref 0.61–1.24)
GFR calc Af Amer: 33 mL/min — ABNORMAL LOW (ref >60)
GFR calc Af Amer: 29 mL/min — ABNORMAL LOW (ref >60)
GFR calc non Af Amer: 25 mL/min — ABNORMAL LOW (ref >60)
GFR, EST NON AFRICAN AMERICAN: 28 mL/min — AB (ref >60)
GLUCOSE: 323 mg/dL — AB (ref 65–99)
Glucose, Bld: 306 mg/dL — ABNORMAL HIGH (ref 65–99)
POTASSIUM: 3.4 mmol/L — AB (ref 3.5–5.1)
Potassium: 3.6 mmol/L (ref 3.5–5.1)
SODIUM: 155 mmol/L — AB (ref 135–145)
SODIUM: 157 mmol/L — AB (ref 135–145)

## 2016-11-13 LAB — GLUCOSE, CAPILLARY
GLUCOSE-CAPILLARY: 294 mg/dL — AB (ref 65–99)
GLUCOSE-CAPILLARY: 333 mg/dL — AB (ref 65–99)
GLUCOSE-CAPILLARY: 316 mg/dL — AB (ref 65–99)
Glucose-Capillary: 307 mg/dL — ABNORMAL HIGH (ref 65–99)
Glucose-Capillary: 295 mg/dL — ABNORMAL HIGH (ref 65–99)
Glucose-Capillary: 310 mg/dL — ABNORMAL HIGH (ref 65–99)

## 2016-11-13 LAB — CULTURE, BLOOD (ROUTINE X 2)
CULTURE: NO GROWTH
Culture: NO GROWTH

## 2016-11-13 LAB — PHOSPHORUS: Phosphorus: 2.6 mg/dL (ref 2.5–4.6)

## 2016-11-13 LAB — MAGNESIUM: MAGNESIUM: 2.6 mg/dL — AB (ref 1.7–2.4)

## 2016-11-13 LAB — CK: Total CK: 5580 U/L — ABNORMAL HIGH (ref 49–397)

## 2016-11-13 MED ORDER — FAMOTIDINE IN NACL 20-0.9 MG/50ML-% IV SOLN
20.0000 mg | INTRAVENOUS | Status: DC
  Administered 2016-11-14 – 2016-11-20 (×7): 20 mg via INTRAVENOUS
  Filled 2016-11-13 (×7): qty 50

## 2016-11-13 MED ORDER — FREE WATER
200.0000 mL | Status: DC
  Administered 2016-11-13 – 2016-11-14 (×7): 200 mL

## 2016-11-13 MED ORDER — INSULIN GLARGINE 100 UNIT/ML ~~LOC~~ SOLN
18.0000 [IU] | Freq: Every day | SUBCUTANEOUS | Status: DC
  Administered 2016-11-13 – 2016-11-14 (×2): 18 [IU] via SUBCUTANEOUS
  Filled 2016-11-13 (×2): qty 0.18

## 2016-11-13 NOTE — Consult Note (Signed)
Neurology Consult Note  Reason for Consultation: unresponsive   Requesting provider: Rebecca Eaton, MD  CC: Unable to obtain as the patient is intubated and unresponsive  HPI: This is a 60 year old man who presented to the emergency department on 11/08/16 after being found unresponsive at home. His resolve per review of the patient's medical record. The patient is unresponsive and unable to provide any information. There is no family present at the bedside.  According to notes, the patient has been living by himself since his wife was admitted to a skilled nursing facility about one month ago. He was last seen to be normal on 11/06/16. On the day of admission, his daughter went by his house and found him unresponsive on the floor, actively vomiting. EMS was activated and on their arrival he was found to have a blood sugar of 21. This was treated with an amp of D50 without any improvement in his mentation. GCS was 4 on arrival in the emergency department and he was noted to have irregular agonal gasping respirations. He was emergently intubated for airway protection. Chest x-ray revealed infiltrates in both lungs concerning for aspiration pneumonia and he was placed on empiric antibiotics. CT scan of the head is obtained but did not show any acute pathology. He was admitted to the ICU for further management. His daughter noted that he has been very depressed over the past few weeks and he had family pictures strewn about the home when she arrived to check on him, raising concern that this may have been an attempt at self harm.  He has remained unresponsive in the ICU since his admission. He was initially maintained on propofol for sedation. EEG was obtained on 11/08/16 and this showed diffuse ground suppression with lack of reactivity to noxious stimulation, suggesting poor prognosis for neurological recovery. However, he was on sedation at the time of the study. His propofol has been off since  11/10/16 but he has not demonstrated any improvement in his neurologic examination off sedation. Neurology is now consulted for further recommendations.  PMH:  Past Medical History:  Diagnosis Date  . Blindness   . Diabetes mellitus ([place])   . GERD (gastroesophageal reflux disease)   . HTN (hypertension)   . Hyperlipidemia   . OSA (obstructive sleep apnea)    AHI 56.3/hr, loud snoring, desaturation to 73% on RA (Dr. Baird Lyons)  . Tachycardia    resting; on low dose B-Blocker    PSH:  Past Surgical History:  Procedure Laterality Date  . CHOLECYSTECTOMY    . TRANSTHORACIC ECHOCARDIOGRAM  08/25/2010   normal; EF=>55%; mild-mod conc LVH; mild MR    Family history: Family History  Problem Relation Age of Onset  . Cancer Paternal Grandmother   . Diabetes Father   . Diabetes Brother   . Diabetes Maternal Grandmother     Social history:  Social History   Social History  . Marital status: Married    Spouse name: N/A  . Number of children: 2  . Years of education: N/A   Occupational History  . disabled    Social History Main Topics  . Smoking status: Never Smoker  . Smokeless tobacco: Never Used  . Alcohol use Yes     Comment: couples times a year  . Drug use: No  . Sexual activity: Not on file   Other Topics Concern  . Not on file   Social History Narrative  . No narrative on file    Current outpatient  meds: No outpatient prescriptions have been marked as taking for the 11/08/16 encounter [facility] Encounter).    Current inpatient meds:  Current Facility-Administered Medications  Medication Dose Route Frequency Provider Last Rate Last Dose  . albuterol (PROVENTIL) (2.5 MG/3ML) 0.083% nebulizer solution 2.5 mg  2.5 mg Nebulization Q2H PRN Marijean Heath, NP      . chlorhexidine gluconate (MEDLINE KIT) (PERIDEX) 0.12 % solution 15 mL  15 mL Mouth Rinse BID Raylene Miyamoto, MD   15 mL at 11/13/16 0720  . etomidate (AMIDATE) injection   Intravenous  PRN Duffy Bruce, MD   20 mg at 11/08/16 1013  . [START ON 11/14/2016] famotidine (PEPCID) IVPB 20 mg premix  20 mg Intravenous Q24H [place], MD      . feeding supplement (PRO-STAT SUGAR FREE 64) liquid 60 mL  60 mL Oral QID [place], MD   60 mL at 11/13/16 1419  . feeding supplement (VITAL HIGH PROTEIN) liquid 1,000 mL  1,000 mL Per Tube Q24H Nicolette Bang, DO   1,000 mL at 11/13/16 1125  . fentaNYL (SUBLIMAZE) injection 100 mcg  100 mcg Intravenous Q15 min PRN Duffy Bruce, MD   100 mcg at 11/10/16 2221  . fentaNYL (SUBLIMAZE) injection 100 mcg  100 mcg Intravenous Q2H PRN Duffy Bruce, MD   100 mcg at 11/13/16 0801  . free water 200 mL  200 mL Per [place], MD   200 mL at 11/13/16 1615  . heparin injection 5,000 Units  5,000 Units Subcutaneous Q8H Marijean Heath, NP   5,000 Units at 11/13/16 1419  . insulin aspart (novoLOG) injection 0-9 Units  0-9 Units Subcutaneous Q4H Colbert Coyer, MD   5 Units at 11/13/16 1602  . insulin aspart (novoLOG) injection 4 Units  4 Units Subcutaneous Q4H Warson [place], MD   4 Units at 11/13/16 [phone]  . insulin glargine (LANTUS) injection 18 Units  18 Units Subcutaneous Daily Omar Person, NP   18 Units at 11/13/16 0925  . labetalol (NORMODYNE,TRANDATE) injection 10 mg  10 mg Intravenous Q2H PRN Colbert Coyer, MD      . MEDLINE mouth rinse  15 mL Mouth Rinse QID Raylene Miyamoto, MD   15 mL at 11/13/16 1604  . metoprolol (LOPRESSOR) injection 5 mg  5 mg Intravenous Q4H PRN [place], MD   5 mg at 11/13/16 0804  . propofol (DIPRIVAN) 1000 MG/100ML infusion  0-50 mcg/kg/min Intravenous Continuous Duffy Bruce, MD   Stopped at 11/10/16 1057  . propofol (DIPRIVAN) 500 MG/50ML infusion   Intravenous Continuous PRN Duffy Bruce, MD 6.9 mL/hr at 11/08/16 1022 10 mcg/kg/min at 11/08/16 1022  . sodium chloride 0.9 % bolus 500 mL  500 mL Intravenous Once  Duffy Bruce, MD      . sodium chloride flush (NS) 0.9 % injection 10-40 mL  10-40 mL Intracatheter Q12H Raylene Miyamoto, MD   10 mL at 11/13/16 0927  . sodium chloride flush (NS) 0.9 % injection 10-40 mL  10-40 mL Intracatheter PRN Raylene Miyamoto, MD      . succinylcholine (ANECTINE) injection   Intravenous PRN Duffy Bruce, MD   100 mg at 11/08/16 1014    Allergies: Allergies  Allergen Reactions  . Lisinopril Cough    cough    ROS: As per HPI. A full 14-point reviewCannot be obtained as the patient is unresponsive  and unable to provide.   PE:  BP (!) 145/94 (BP Location: Left Arm)   Pulse (!) 130   Temp 99.3 F (37.4 C) (Core (Comment)) Comment (Src): foley  Resp 19   Ht 6' (1.829 m)   Wt 105.6 kg (232 lb 12.9 oz)   SpO2 97%   BMI 31.57 kg/m   General: Obese African-American male lying in ICU bed. He is intubated. There is no sedation running. He has some spontaneous blinking but does not open his eyes either spontaneously or with stimulation. He does not follow commands. HEENT: Normocephalic. Neck supple without LAD. ETT, OGT in place. Corneal scarring is noted in the right eye. The left cornea is obscured by conjunctival tissue overgrowth. Pupils are not visible.  CV: Tachy, regular, no obvious murmur. However, heart sounds are somewhat obscures by body habitus and loud upper airway sounds.  Lungs: Rhonchi noted bilaterally on anterior exam.  Abdomen: Soft, obese, non-distended. Bowel sounds hypoactive.  Extremities: Mild pedal edema noted bilaterally. Neuro:  CN: Pupils are not visible due to right corneal scarring and left conjunctival overgrowth. He has a history of blindness in both eyes and does not blink to visual threat. Corneals are present and appear symmetric. His face appears grossly symmetric but is partly obscured by tubes and tape. He does not respond to supraorbital pressure on either side. He has a spontaneous cough. The remainder of his cranial nerve  examination is limited by his inability to participate with the exam. Motor: Normal bulk. Tone is diminished throughout. No spontaneous movement of the extremities is noted. No tremor or other abnormal movements.   Sensation: He has triple flexion to pain in both lower extremities with limited response in the upper extremities. With central pain there is some head movement but no clear withdrawal.  DTRs: Absent throughout. Toes mute bilaterally.  Coordination and gait: These cannot be assessed as the patient is unable to participate with the examination.   Labs:  Lab Results  Component Value Date   WBC 13.1 (H) 11/13/2016   HGB 12.6 (L) 11/13/2016   HCT 39.4 11/13/2016   PLT 123 (L) 11/13/2016   GLUCOSE 323 (H) 11/13/2016   TRIG 149 11/09/2016   ALT 190 (H) 11/12/2016   AST 217 (H) 11/12/2016   NA 155 (H) 11/13/2016   K 3.6 11/13/2016   CL 123 (H) 11/13/2016   CREATININE 2.36 (H) 11/13/2016   BUN 71 (H) 11/13/2016   CO2 23 11/13/2016   TSH 2.43 01/29/2015   HGBA1C 7.9 10/09/2016   MICROALBUR 4.3 (H) 01/29/2015    Imaging:  I have personally and independently reviewed the MRI scan of the brain without contrast from today. There is no evidence of restricted diffusion. He has a mild degree of chronic small vessel ischemic change involving the bihemispheric white matter. Mild diffuse generalized atrophy is also noted. Cavum septum pellucidum is noted.  Other diagnostic studies:  EEG from 11/08/16 was interpreted as showing diffuse background suppression with lack of EEG reactivity with noxious stimulation.  Assessment and Plan:  1. Hypoxic-ischemic brain injury: Presentation is most concerning for hypoxic-ischemic injury caused by severe and likely prolonged hypoglycemia. This is further compounded by respiratory failure with aspiration pneumonia. MRI of the brain was obtained today and did not reveal any acute abnormalities. However, this does not exclude hypoglycemic injury. At this  point, treatment is supportive as noted below.  2. Hypoxic-ischemic encephalopathy: This is acute, likely due to prolonged severe hypoglycemia. Encephalopathy may be  further compounded by respiratory failure, aspiration pneumonia, hypernatremia, renal failure, liver dysfunction, and medication effect. He has now been off of propofol since 11/24 but has not demonstrated any improvement in his neurologic status. He is not receiving any significant sedation that would be expected to limit his neurologic recovery, presently only receiving occasional doses of when necessary fentanyl 100 g for comfort. His lack of improvement to date is suggestive of poor prognosis for meaningful neurological recovery but the presence of multiple metabolic and medical issues clouds this picture. EEG showed background suppression with poor reactivity, findings which are consistent with poor prognosis. However, the study was performed while the patient was still receiving sedation. I would recommend repeating EEG in a.m. now that he has been off of sedation for a few days. MRI did not show any obvious structural pathology but this does not exclude severe hypoxic-ischemic injury from hypoglycemia. Continue supportive care. Minimize CNS active medications, specifically opiates, benzodiazepines, and anything with strong anticholinergic properties. Continue to optimize metabolic status as you are.  No family was present at the time of my visit.  Thank you for this consultation. Neurology will continue to follow with you. Please call with any urgent questions or concerns.  This patient is critically ill and at significant risk of neurological worsening, death and care requires constant monitoring of vital signs, hemodynamics,respiratory and cardiac monitoring, neurological assessment, discussion with family, other specialists and medical decision making of high complexity. A total of 45 minutes of critical care time was spent on this  case.

## 2016-11-13 NOTE — Progress Notes (Signed)
Inpatient Diabetes Program Recommendations  AACE/ADA: New Consensus Statement on Inpatient Glycemic Control (2015)  Target Ranges:  Prepandial:   less than 140 mg/dL      Peak postprandial:   less than 180 mg/dL (1-2 hours)      Critically ill patients:  140 - 180 mg/dL   Lab Results  Component Value Date   GLUCAP 307 (H) 11/13/2016   HGBA1C 7.9 10/09/2016    Review of Glycemic Control  Diabetes history: Insulin requiring DM 2  Outpatient Diabetes medications: Lantus 200 units Daily (A1c 7.9% on 10/09/16) Current orders for Inpatient glycemic control: Nvoolog Sensitive Q4hours, Novolog 4 units Q4 hours Tube Feed Coverage  Inpatient Diabetes Program Recommendations:    Sees Dr. Romero BellingSean Ellison (Endocrinologist). Glucose 200-300 range. Spoke with [name], NP. Due to renal function, Diagnosis of Rhabdo Lantus 18 units Daily ordered. Will watch trends.  Thanks,  Christena DeemShannon Demontrez Rindfleisch RN, MSN, [facility] Inpatient Diabetes Coordinator Team Pager [phone] (8a-5p)

## 2016-11-13 NOTE — Progress Notes (Signed)
PULMONARY / CRITICAL CARE MEDICINE   Name: Andrew [name] MRN: [id number] DOB: [birth year]    ADMISSION DATE:  11/08/2016  REFERRING MD:  EDP   CHIEF COMPLAINT:  AMS, respiratory failure, aspiration   HISTORY OF PRESENT ILLNESS:   60 yo male with hx IDDM, GERD, HTN, OSA presented 11/22 after being found unresponsive at home for unknown period of time.  He lives with his wife but she has been in SNF for the last month.  Was last seen normal 2 days prior although family is trying to determine if his home health aide saw him in the interim.  Was found to have glucose 21, no improvement in mental status with D50.  He was bagged by EMS en route and had multiple episodes of vomiting with obvious aspiration.  Intubated on arrival to ER and PCCM called to admit.  Of note, daughter concerned that he has been very depressed the last few weeks.  There were family pictures strewn about the house when family arrived.  He has had little to no PO intake but continues to take his insulin per family.       SUBJECTIVE:  Remains critically ill.  Not on pressors.  No purposeful mm. Off propofol x 3 days    VITAL SIGNS: BP (!) 163/90 (BP Location: Left Arm)   Pulse (!) 128   Temp 99.3 F (37.4 C) (Temporal) Comment (Src): foley  Resp (!) 27   Ht 6' (1.829 m)   Wt 105.6 kg (232 lb 12.9 oz)   SpO2 96%   BMI 31.57 kg/m   HEMODYNAMICS:    VENTILATOR SETTINGS: Vent Mode: PRVC FiO2 (%):  [30 %] 30 % Set Rate:  [18 bmp] 18 bmp Vt Set:  [620 mL] 620 mL PEEP:  [5 cmH20] 5 cmH20 Pressure Support:  [12 cmH20] 12 cmH20 Plateau Pressure:  [28 cmH20-29 cmH20] 29 cmH20  INTAKE / OUTPUT: I/O last 3 completed shifts: In: 2562.5 [I.V.:60; NG/GT:2140; IV Piggyback:362.5] Out: 4230 [Urine:3930; Stool:300]  PHYSICAL EXAMINATION: General:  Chronically ill appearing male, unresponsive. Eye opening but not purposeful.  Neuro:  CN unremarkable.eye opening but not purposeful. Breathes over vent. (-)  purposeful mm HEENT:  Mm dry, ETT  Cardiovascular:  s1s2 rrr Lungs:  resps even non labored on vent, scattered rhonchi  Abdomen:  Round, soft, hypoactive bs  Musculoskeletal:  Warm and dry, no edema, venous stasis   LABS:  BMET  Recent Labs Lab 11/11/16 0445 11/12/16 0355 11/13/16 0353  NA 144 152* 155*  K 3.7 3.3* 3.6  CL 113* 120* 123*  CO2 23 25 23   BUN 41* 56* 71*  CREATININE 2.54* 2.38* 2.36*  GLUCOSE 393* 300* 323*    Electrolytes  Recent Labs Lab 11/11/16 0445 11/12/16 0355 11/13/16 0353  CALCIUM 7.7* 8.4* 9.1  MG 2.2 2.8* 2.6*  PHOS 2.4* 2.4* 2.6    CBC  Recent Labs Lab 11/11/16 0445 11/12/16 0355 11/13/16 0353  WBC 12.4* 13.0* 13.1*  HGB 11.8* 12.1* 12.6*  HCT 36.6* 37.5* 39.4  PLT 107* 124* 123*    Coag's No results for input(s): APTT, INR in the last 168 hours.  Sepsis Markers  Recent Labs Lab 11/08/16 1330  11/08/16 1341 11/08/16 1656 11/08/16 2338  LATICACIDVEN  --   < > 3.91* 3.8* 3.9*  PROCALCITON 7.39  --   --   --   --   < > = values in this interval not displayed.  ABG  Recent Labs Lab 11/08/16 1146  11/09/16 0549  PHART 7.404 7.431  PCO2ART 37.2 28.8*  PO2ART 192.0* 78.1*    Liver Enzymes  Recent Labs Lab 11/10/16 0420 11/11/16 0445 11/12/16 0355  AST 463* 337* 217*  ALT 193* 204* 190*  ALKPHOS 68 89 113  BILITOT 1.1 1.8* 1.0  ALBUMIN 2.1* 2.1* 2.0*    Cardiac Enzymes No results for input(s): TROPONINI, PROBNP in the last 168 hours.  Glucose  Recent Labs Lab 11/12/16 1619 11/12/16 2006 11/12/16 2358 11/13/16 0408 11/13/16 0749 11/13/16 1201  GLUCAP 278* 350* 333* 294* 307* 333*    Imaging   STUDIES:  EEG 11/22>>> severe diffuse dysfxn Ct head 11/22>>>neg acute   CULTURES: Sputum 11/22>>> (-) MRSA 11/22 > (-) Blood 11/22 > (-)  ANTIBIOTICS: Zosyn 11/22>>> 11/27  SIGNIFICANT EVENTS:  11/22 admit after being found unresponsive. Hypogly. proloned down time.   LINES/TUBES: ETT  11/22>>>  DISCUSSION: 60 yo male with severe hypoglycemia and persistent AMS and acute respiratory failure with obvious aspiration.    ASSESSMENT / PLAN:  PULMONARY Acute hypoxemic respiratory failure  2/2 unable to protect airway and asp pna. Possible HCAP.  P:   abx as above. Will d/c zosyn > got 5 days Vent support - 8cc/kg  Mental status is barrier to weaning.    CARDIOVASCULAR Hx HTN  Demand ischemia P:  Hold home cozaar, metoprolol for now - will likely need to resume  Trend BP   RENAL AKI - mild  Severe Rhabdo. Improving.  Hypernatremia P:   Cont  TF  Increase free water. Check lytes this pm, if still elevated, will add D5 Water 50 mls/hr.  F/u CK total.   GASTROINTESTINAL Elevated transaminases P:   Trend LFT's  TF pepcid   HEMATOLOGIC No active issue  P:  F/u CBC  SQ heparin   INFECTIOUS Aspiration PNA - will cover nosocomial as he has been visiting his wife frequently in SNF the last month P:   Zosyn as above. Will d/c today. Got 5 days.  Re-culture if he spikes.    ENDOCRINE IDDM  Severe hypoglycemia -- unclear if suicide attempt given depression s/s, pictures strewn about the home . CBG better.  P:   cont TF with SSI and Lantus  NEUROLOGIC AMS - r/t severe hypoglycemia with unknown down time.  Last seen normal 2 days prior to admit P:   RASS goal: 0 Off propofol  11/24 PRN fentanyl Daily WUA when able EEG c/w severe cerebral injury.  Plan for Neuro evaln and MRI 11/27 > once seen, need to update wife. Likely poor prognosis and will not survive. Recommend comfort care at that point.     FAMILY  - Updates:  Daughter updated on 11/23. Spoke to wife 11/25.   - Inter-disciplinary family meet or Palliative Care meeting due by:  11/29   Critical care time with this patient today : 30 minutes.   Pollie MeyerJ. Angelo A de Dios, MD 11/13/2016, 2:22 PM [facility] Pager [phone] After 3 pm or if no answer, call  [phone]

## 2016-11-13 NOTE — Care Management Important Message (Signed)
Important Message  Patient Details  Name: Andrew [name] NeedsJohnny R Windmiller MRN: [id number] Date of Birth: 09-26-1956   Medicare Important Message Given:  Yes    Kyla BalzarineShealy, Arafat Cocuzza [name] 11/13/2016, 11:27 AM

## 2016-11-13 NOTE — Progress Notes (Signed)
  RT transported pt to and from 2M09 to MRI 2 on ventilator. Pt stable throughout with no complications. RT will continue to monitor.

## 2016-11-14 ENCOUNTER — Inpatient Hospital Stay (HOSPITAL_COMMUNITY): Payer: Medicare Other

## 2016-11-14 DIAGNOSIS — G9340 Encephalopathy, unspecified: Secondary | ICD-10-CM

## 2016-11-14 LAB — CBC
HCT: 39.9 % (ref 39.0–52.0)
Hemoglobin: 12.7 g/dL — ABNORMAL LOW (ref 13.0–17.0)
MCH: 29.1 pg (ref 26.0–34.0)
MCHC: 31.8 g/dL (ref 30.0–36.0)
MCV: 91.5 fL (ref 78.0–100.0)
PLATELETS: 114 10*3/uL — AB (ref 150–400)
RBC: 4.36 MIL/uL (ref 4.22–5.81)
RDW: 16.6 % — ABNORMAL HIGH (ref 11.5–15.5)
WBC: 14 10*3/uL — AB (ref 4.0–10.5)

## 2016-11-14 LAB — GLUCOSE, CAPILLARY
GLUCOSE-CAPILLARY: 285 mg/dL — AB (ref 65–99)
GLUCOSE-CAPILLARY: 302 mg/dL — AB (ref 65–99)
GLUCOSE-CAPILLARY: 293 mg/dL — AB (ref 65–99)
Glucose-Capillary: 266 mg/dL — ABNORMAL HIGH (ref 65–99)
Glucose-Capillary: 301 mg/dL — ABNORMAL HIGH (ref 65–99)

## 2016-11-14 LAB — BASIC METABOLIC PANEL
Anion gap: 9 (ref 5–15)
BUN: 81 mg/dL — ABNORMAL HIGH (ref 6–20)
CALCIUM: 9.4 mg/dL (ref 8.9–10.3)
CHLORIDE: 126 mmol/L — AB (ref 101–111)
CO2: 24 mmol/L (ref 22–32)
CREATININE: 2.6 mg/dL — AB (ref 0.61–1.24)
GFR, EST AFRICAN AMERICAN: 29 mL/min — AB (ref >60)
GFR, EST NON AFRICAN AMERICAN: 25 mL/min — AB (ref >60)
Glucose, Bld: 304 mg/dL — ABNORMAL HIGH (ref 65–99)
Potassium: 3.7 mmol/L (ref 3.5–5.1)
SODIUM: 159 mmol/L — AB (ref 135–145)

## 2016-11-14 LAB — PHOSPHORUS: PHOSPHORUS: 4.3 mg/dL (ref 2.5–4.6)

## 2016-11-14 LAB — MAGNESIUM: MAGNESIUM: 2.5 mg/dL — AB (ref 1.7–2.4)

## 2016-11-14 LAB — CK: Total CK: 2472 U/L — ABNORMAL HIGH (ref 49–397)

## 2016-11-14 MED ORDER — DEXTROSE 5 % IV SOLN
INTRAVENOUS | Status: DC
  Administered 2016-11-14 – 2016-11-19 (×4): via INTRAVENOUS

## 2016-11-14 MED ORDER — FREE WATER
300.0000 mL | Status: DC
  Administered 2016-11-14 – 2016-11-16 (×10): 300 mL

## 2016-11-14 MED ORDER — METOPROLOL TARTRATE 25 MG/10 ML ORAL SUSPENSION
12.5000 mg | Freq: Two times a day (BID) | ORAL | Status: DC
  Administered 2016-11-14 – 2016-11-16 (×6): 12.5 mg via ORAL
  Filled 2016-11-14 (×7): qty 5

## 2016-11-14 MED ORDER — INSULIN GLARGINE 100 UNIT/ML ~~LOC~~ SOLN
30.0000 [IU] | Freq: Every day | SUBCUTANEOUS | Status: DC
  Administered 2016-11-15 – 2016-11-16 (×2): 30 [IU] via SUBCUTANEOUS
  Filled 2016-11-14 (×3): qty 0.3

## 2016-11-14 NOTE — Procedures (Signed)
ELECTROENCEPHALOGRAM REPORT  Date of Study: 11/14/16  Patient's Name: Andrew [name] R Mathews MRN: [id number] Date of Birth: Dec 16, 1956  Clinical History: 60 y/o with encephalopathy  Technical Summary: A multichannel digital EEG recording measured by the international 10-20 system with electrodes applied with paste and impedances below 5000 ohms performed in our laboratory with EKG monitoring in an intubated and nonsedated patient.  Hyperventilation and photic stimulation were not performed.  The digital EEG was referentially recorded, reformatted, and digitally filtered in a variety of bipolar and referential montages for optimal display.    Description: The patient is intubated and not sedated during the recording. There is loss of normal background activity. The records read at a sensitivity of 3 uV/mm shows diffuse suppression of background activity. There is no spontaneous reactivity or reactivity of the background noted with noxious stimulation, although the technicion does note that the patient physically withdrawls some with noxious stimuli.  Hyperventilation and photic stimulation were not performed. There were no epileptiform discharges or electrographic seizures seen.   EKG lead demonstrated a tachycardic rhythm.  Impression: This EEG is markedly abnormal due to diffuse background suppression and lack of EEG reactivity with noxious stimulation.  Clinical Correlation of the above findings indicates severe diffuse cerebral dysfunction that is non-specific in etiology and can be seen in the setting of anoxic/ischemic injury, toxic/metabolic encephalopathies, or medication effect. In the absence of CNS active, sedating, or anesthetic medications, this suggests a poor prognosis. Clinical correlation is advised.

## 2016-11-14 NOTE — Progress Notes (Signed)
EEG completed; results pending.    

## 2016-11-14 NOTE — Progress Notes (Signed)
Inpatient Diabetes Program Recommendations  AACE/ADA: New Consensus Statement on Inpatient Glycemic Control (2015)  Target Ranges:  Prepandial:   less than 140 mg/dL      Peak postprandial:   less than 180 mg/dL (1-2 hours)      Critically ill patients:  140 - 180 mg/dL   Lab Results  Component Value Date   GLUCAP 266 (H) 11/14/2016   HGBA1C 7.9 10/09/2016    Review of Glycemic Control:Results for Andrew [name], Rande R (MRN [id number]) as of 11/14/2016 10:12  Ref. Range 11/13/2016 15:37 11/13/2016 19:41 11/13/2016 23:26 11/14/2016 03:49 11/14/2016 08:10  Glucose-Capillary Latest Ref Range: 65 - 99 mg/dL 213295 (H) 086316 (H) 578310 (H) 285 (H) 266 (H)     Inpatient Diabetes Program Recommendations:   May consider increasing Lantus to 30 units daily.   Thanks, Beryl MeagerJenny Eular Panek, RN, BC-ADM Inpatient Diabetes Coordinator Pager [phone] (8a-5p)

## 2016-11-14 NOTE — Progress Notes (Signed)
PULMONARY / CRITICAL CARE MEDICINE   Name: Andrew [name] MRN: [id number] DOB: [birth year]    ADMISSION DATE:  11/08/2016  REFERRING MD:  EDP   CHIEF COMPLAINT:  AMS, respiratory failure, aspiration   Brief:   60 yo male with hx IDDM, GERD, HTN, OSA presented 11/22 after being found unresponsive at home for unknown period of time.  He lives with his wife but she has been in SNF for the last month.  Was last seen normal 2 days prior although family is trying to determine if his home health aide saw him in the interim.  Was found to have glucose 21, no improvement in mental status with D50.  He was bagged by EMS en route and had multiple episodes of vomiting with obvious aspiration.  Intubated on arrival to ER and PCCM called to admit.  Of note, daughter concerned that he has been very depressed the last few weeks.  There were family pictures strewn about the house when family arrived.  He has had little to no PO intake but continues to take his insulin per family.      SUBJECTIVE:  No improvement in neuro status. No events overnight.   VITAL SIGNS: BP (!) 112/57   Pulse (!) 124   Temp 99.3 F (37.4 C)   Resp (!) 28   Ht 6' (1.829 m)   Wt 104.4 kg (230 lb 2.6 oz)   SpO2 95%   BMI 31.22 kg/m   HEMODYNAMICS:    VENTILATOR SETTINGS: Vent Mode: PRVC FiO2 (%):  [30 %] 30 % Set Rate:  [18 bmp] 18 bmp Vt Set:  [620 mL] 620 mL PEEP:  [5 cmH20] 5 cmH20 Plateau Pressure:  [16 cmH20-29 cmH20] 22 cmH20  INTAKE / OUTPUT: I/O last 3 completed shifts: In: 3250 [I.V.:200; NG/GT:2900; IV Piggyback:150] Out: 4480 [Urine:4180; Stool:300]  PHYSICAL EXAMINATION: General:  Chronically ill appearing male, on vent  Neuro: no purposeful movements, no physical response to physical stimulation, pos gag    HEENT:  Pupils are unable to be assessed as cataracts obstruct view, ETT  Cardiovascular:  S1s2, tachy, generalized edema  Lungs:  Unlabored, diminished breath sounds, on vent  Abdomen:   Round, soft, hypoactive bs  Musculoskeletal:  Warm and dry, venous stasis   LABS:  BMET  Recent Labs Lab 11/13/16 0353 11/13/16 1700 11/14/16 0405  NA 155* 157* 159*  K 3.6 3.4* 3.7  CL 123* 126* 126*  CO2 23 23 24   BUN 71* 75* 81*  CREATININE 2.36* 2.65* 2.60*  GLUCOSE 323* 306* 304*    Electrolytes  Recent Labs Lab 11/12/16 0355 11/13/16 0353 11/13/16 1700 11/14/16 0405  CALCIUM 8.4* 9.1 9.1 9.4  MG 2.8* 2.6*  --  2.5*  PHOS 2.4* 2.6  --  4.3    CBC  Recent Labs Lab 11/12/16 0355 11/13/16 0353 11/14/16 0405  WBC 13.0* 13.1* 14.0*  HGB 12.1* 12.6* 12.7*  HCT 37.5* 39.4 39.9  PLT 124* 123* 114*    Coag's No results for input(s): APTT, INR in the last 168 hours.  Sepsis Markers  Recent Labs Lab 11/08/16 1330  11/08/16 1341 11/08/16 1656 11/08/16 2338  LATICACIDVEN  --   < > 3.91* 3.8* 3.9*  PROCALCITON 7.39  --   --   --   --   < > = values in this interval not displayed.  ABG  Recent Labs Lab 11/08/16 1146 11/09/16 0549  PHART 7.404 7.431  PCO2ART 37.2 28.8*  PO2ART 192.0* 78.1*  Liver Enzymes  Recent Labs Lab 11/10/16 0420 11/11/16 0445 11/12/16 0355  AST 463* 337* 217*  ALT 193* 204* 190*  ALKPHOS 68 89 113  BILITOT 1.1 1.8* 1.0  ALBUMIN 2.1* 2.1* 2.0*    Cardiac Enzymes No results for input(s): TROPONINI, PROBNP in the last 168 hours.  Glucose  Recent Labs Lab 11/13/16 1201 11/13/16 1537 11/13/16 1941 11/13/16 2326 11/14/16 0349 11/14/16 0810  GLUCAP 333* 295* 316* 310* 285* 266*    Imaging   STUDIES:  EEG 11/22 > severe diffuse dysfxn Ct head 11/22 > neg acute  MRI Head 11/27 > Mild atrophy and white matter disease is slightly advanced for age. This likely reflects the sequela of chronic microvascular ischemia  CULTURES: Sputum 11/22>>> (-) MRSA 11/22 > (-) Blood 11/22 > (-)  ANTIBIOTICS: Zosyn 11/22>>> 11/27  SIGNIFICANT EVENTS: 11/22 admit after being found unresponsive. Hypogly. proloned  down time.   LINES/TUBES: ETT 11/22>>>  DISCUSSION: 60 yo male with severe hypoglycemia and persistent AMS and acute respiratory failure with obvious aspiration.    ASSESSMENT / PLAN:  PULMONARY Acute hypoxemic respiratory failure  2/2 unable to protect airway and asp pna. Possible HCAP.  P:   Vent support - 8cc/kg  Mental status is barrier to weaning   CARDIOVASCULAR Hx HTN  Demand ischemia Tachycardia  P:  Hold home cozaar Start Metoprolol 12.5 BID  Trend BP  Cardiac Monitoring   RENAL AKI - mild  Severe Rhabdo. Improving.  Hypernatremia P:   Replace Electrolytes as needed  Free water 200 Q4H Add D5 water at 7350ml/hr  Trend CK Total   GASTROINTESTINAL Elevated transaminases - down-trending   P:   Trend LFT's  TF pepcid   HEMATOLOGIC No active issue  P:  Trend CBC  SQ heparin   INFECTIOUS Aspiration PNA - will cover nosocomial as he has been visiting his wife frequently in SNF the last month P:    Re-culture if he spikes.   Trend WBC and Fever Curve   ENDOCRINE IDDM  Severe hypoglycemia -- unclear if suicide attempt given depression s/s, pictures strewn about the home . CBG better.  P:   cont TF   SSI and Lantus and TF coverage   NEUROLOGIC AMS - r/t severe hypoglycemia with unknown down time.  Last seen normal 2 days prior to admit -EEG c/w severe cerebral injury. -Poor prognosis need family goals of care  P:   Repeat EEG today  RASS goal: 0 PRN fentanyl  FAMILY  - Updates:  Daughter updated on 11/23. Spoke to wife 11/25.   - Inter-disciplinary family meet or Palliative Care meeting due by:  11/29  Critical care time with this patient today : 31 minutes.   [name], AG-ACNP [facility]  Pgr: [phone]  PCCM Pgr: [phone]  Attending Note:  60 year old male with history of DM who presents to the hospital with hypoglycemic encephalopathy who is unresponsive and remains unresponsive.  I reviewed CXR  myself, ETT ok.  On exam, patient remains unresponsive and not following any commands with clean lungs.  Discussed with neurology, EEG is the same making prognosis very poor.  I spoke with family (wife and two daughters) at length.  They are insistent that withdraw is not an option.  Will plan on tracheostomy towards the end of the week and stay as full code.  In the meantime, continue current interventions, maintain fluid even.  Increase free water to 300 q4 and monitor.  The  patient is critically ill with multiple organ systems failure and requires high complexity decision making for assessment and support, frequent evaluation and titration of therapies, application of advanced monitoring technologies and extensive interpretation of multiple databases.   Critical Care Time devoted to patient care services described in this note is  35  Minutes. This time reflects time of care of this signee Dr Jennet Maduro. This critical care time does not reflect procedure time, or teaching time or supervisory time of PA/NP/Med student/Med Resident etc but could involve care discussion time.  Rush Farmer, M.D. [facility]monary/Critical Care Medicine. Pager: [phone]. After hours pager: [phone].

## 2016-11-14 NOTE — Care Management Note (Signed)
Case Management Note  Patient Details  Name: Sherran NeedsJohnny R Clayborn MRN: [id number] Date of Birth: 1956-05-11  Subjective/Objective:     Pt admitted after being found unresponsive for unknown amount of time               Action/Plan:  PTA from home.  Wife recently stayed in SNF.  Per attending prognosis is very poor -  however when he spoke with family (wife and two daughters) at length they were insistent that withdraw is not an option.  Will plan on tracheostomy towards the end of the week and stay as full code.  CM will continue to follow for discharge needs  Expected Discharge Date:                  Expected Discharge Plan:     In-House Referral:  Clinical Social Work  Discharge planning Services  CM Consult  Post Acute Care Choice:    Choice offered to:     DME Arranged:    DME Agency:     HH Arranged:    HH Agency:     Status of Service:  In process, will continue to follow  If discussed at Long Length of Stay Meetings, dates discussed:    Additional Comments:  Cherylann ParrClaxton, Deatra Mcmahen S, RN 11/14/2016, 2:31 PM

## 2016-11-14 NOTE — Progress Notes (Signed)
Neurology Progress Note  Subjective: No major 24 hour events. He remains unresponsive in the ICU. He is not able to provide ROS as he is intubated and unresponsive. He has not had any sedation today. His last sedation was fentanyl 100 mcg given at 1720 on 11/27. This is the only sedation he has received in the past 24 hours.   Current Meds:   Current Facility-Administered Medications:  .  albuterol (PROVENTIL) (2.5 MG/3ML) 0.083% nebulizer solution 2.5 mg, 2.5 mg, Nebulization, Q2H PRN, Marijean Heath, NP .  chlorhexidine gluconate (MEDLINE KIT) (PERIDEX) 0.12 % solution 15 mL, 15 mL, Mouth Rinse, BID, Raylene Miyamoto, MD, 15 mL at 11/14/16 0849 .  dextrose 5 % solution, , Intravenous, Continuous, Omar Person, NP, Last Rate: 50 mL/hr at 11/14/16 1000 .  famotidine (PEPCID) IVPB 20 mg premix, 20 mg, Intravenous, Q24H, Jose Angelo A Corrie Dandy, MD, 20 mg at 11/14/16 0848 .  feeding supplement (PRO-STAT SUGAR FREE 64) liquid 60 mL, 60 mL, Oral, QID, Jose Angelo A Corrie Dandy, MD, 60 mL at 11/14/16 0945 .  feeding supplement (VITAL HIGH PROTEIN) liquid 1,000 mL, 1,000 mL, Per Tube, Q24H, Nicolette Bang, DO, 1,000 mL at 11/13/16 1125 .  fentaNYL (SUBLIMAZE) injection 100 mcg, 100 mcg, Intravenous, Q15 min PRN, Duffy Bruce, MD, 100 mcg at 11/10/16 2221 .  fentaNYL (SUBLIMAZE) injection 100 mcg, 100 mcg, Intravenous, Q2H PRN, Duffy Bruce, MD, 100 mcg at 11/13/16 1720 .  free water 300 mL, 300 mL, Per Tube, Q4H, Rush Farmer, MD .  heparin injection 5,000 Units, 5,000 Units, Subcutaneous, Q8H, Marijean Heath, NP, 5,000 Units at 11/14/16 0545 .  insulin aspart (novoLOG) injection 0-9 Units, 0-9 Units, Subcutaneous, Q4H, Colbert Coyer, MD, 7 Units at 11/14/16 1222 .  insulin aspart (novoLOG) injection 4 Units, 4 Units, Subcutaneous, Q4H, Jakin, MD, 4 Units at 11/14/16 1222 .  [START ON 11/15/2016] insulin glargine (LANTUS) injection 30 Units, 30  Units, Subcutaneous, Daily, Omar Person, NP .  MEDLINE mouth rinse, 15 mL, Mouth Rinse, QID, Raylene Miyamoto, MD, 15 mL at 11/14/16 1224 .  metoprolol (LOPRESSOR) injection 5 mg, 5 mg, Intravenous, Q4H PRN, Jose Shirl Harris, MD, 5 mg at 11/13/16 1720 .  metoprolol tartrate (LOPRESSOR) 25 mg/10 mL oral suspension 12.5 mg, 12.5 mg, Oral, BID, Omar Person, NP, 12.5 mg at 11/14/16 0945 .  sodium chloride flush (NS) 0.9 % injection 10-40 mL, 10-40 mL, Intracatheter, Q12H, Raylene Miyamoto, MD, 10 mL at 11/14/16 0949 .  sodium chloride flush (NS) 0.9 % injection 10-40 mL, 10-40 mL, Intracatheter, PRN, Raylene Miyamoto, MD  Objective:  Temp:  [98.8 F (37.1 C)-99.9 F (37.7 C)] 99.3 F (37.4 C) (11/28 1000) Pulse Rate:  [107-138] 118 (11/28 1232) Resp:  [18-32] 29 (11/28 1232) BP: (110-163)/(52-107) 151/93 (11/28 1232) SpO2:  [91 %-99 %] 97 % (11/28 1232) FiO2 (%):  [30 %] 30 % (11/28 1232) Weight:  [104.4 kg (230 lb 2.6 oz)] 104.4 kg (230 lb 2.6 oz) (11/28 0437)  General: WD obese AA man lying in ICU bed. He is unresponsive to verbal, tactile, and noxious stimuli. He has no eye opening spontaneously or with stimulation. He does not follow any commands. HEENT: Neck is supple without lymphadenopathy. ETT, OGT in place. There is left corneal scarring and the right eye shows conjunctival tissue extending over the sclera.  CV: Distant, tachy, regular, no obvious murmur. Carotid pulses are  2+ and symmetric with no bruits. Distal pulses 2+ and symmetric.  Lungs: Clear but distant due to body habitus.   Neuro: MS: As noted above. CN: Pupils are not visible due to baseline ocular pathology. He is blind at baseline. Corneals are intact. His face is grossly symmetric but partly obscured by tubes and tape. The remainder of his cranial nerves cannot be accurately assessed as he is not able to participate with the exam.  Motor: Normal bulk. Tone is increased in the RUE, decreased  otherwise. No spontaneous or purposeful movements are observed. No tremor or other abnormal movements are observed.  Sensation: He has some triple flexion to nailbed pressure in the RLE with weak flexion in the RUE. No response to nailbed pressure on the L. He moves his head to central pain but does not localize.   DTRs: Absent throughout. Toes are mute bilaterally.  Coordination and gait: These cannot be assessed as the patient is not able to participate with the exam.   Labs: Lab Results  Component Value Date   WBC 14.0 (H) 11/14/2016   HGB 12.7 (L) 11/14/2016   HCT 39.9 11/14/2016   PLT 114 (L) 11/14/2016   GLUCOSE 304 (H) 11/14/2016   TRIG 149 11/09/2016   ALT 190 (H) 11/12/2016   AST 217 (H) 11/12/2016   NA 159 (H) 11/14/2016   K 3.7 11/14/2016   CL 126 (H) 11/14/2016   CREATININE 2.60 (H) 11/14/2016   BUN 81 (H) 11/14/2016   CO2 24 11/14/2016   TSH 2.43 01/29/2015   HGBA1C 7.9 10/09/2016   MICROALBUR 4.3 (H) 01/29/2015   CBC Latest Ref Rng & Units 11/14/2016 11/13/2016 11/12/2016  WBC 4.0 - 10.5 K/uL 14.0(H) 13.1(H) 13.0(H)  Hemoglobin 13.0 - 17.0 g/dL 12.7(L) 12.6(L) 12.1(L)  Hematocrit 39.0 - 52.0 % 39.9 39.4 37.5(L)  Platelets 150 - 400 K/uL 114(L) 123(L) 124(L)    Lab Results  Component Value Date   HGBA1C 7.9 10/09/2016   Lab Results  Component Value Date   ALT 190 (H) 11/12/2016   AST 217 (H) 11/12/2016   ALKPHOS 113 11/12/2016   BILITOT 1.0 11/12/2016   Phos 4.3 Mg 2.5 CK 2472  Radiology: There is no new neuroimaging for review.   Other diagnostic studies:  EEG today showed diffuse background suppression with lack of reactivity to noxious stimulation.   A/P:   1. Hypoxic-ischemic (anoxic) brain injury: Presentation is consistent with severe hypoxic-ischemic injury caused by extreme and likely prolonged hypoglycemia. This is further compounded by respiratory failure with aspiration pneumonia. MRI of the brain did not reveal any acute abnormalities but  this does not exclude hypoglycemic injury. At this point, treatment is supportive as noted below.  2. Hypoxic-ischemic (anoxic) encephalopathy: This is acute, likely due to prolonged severe hypoglycemia. Encephalopathy may be further compounded by respiratory failure, aspiration pneumonia, hypernatremia, renal failure, liver dysfunction, and medication effect. He has been off sedation for over four days at this point with no improvement in his neurologic status. EEG was repeated today now that he is no longer sedated and this again showed diffuse background suppression without reactivity. This pattern supports and severe global brain injury and is portends poor prognosis for meaningful neurologic recovery, as does his clinical exam. Continue supportive care. Minimize CNS active medications, specifically opiates, benzodiazepines, and anything with strong anticholinergic properties. Continue to optimize metabolic status as you are.  This patient is critically ill and at significant risk of neurological worsening, death and care requires constant monitoring  of vital signs, hemodynamics,respiratory and cardiac monitoring, neurological assessment, discussion with family, other specialists and medical decision making of high complexity. A total of 30 minutes of critical care time was spent on this case.    Melba Coon, MD [facility]

## 2016-11-15 ENCOUNTER — Inpatient Hospital Stay (HOSPITAL_COMMUNITY): Payer: Medicare Other

## 2016-11-15 LAB — GLUCOSE, CAPILLARY
GLUCOSE-CAPILLARY: 155 mg/dL — AB (ref 65–99)
GLUCOSE-CAPILLARY: 167 mg/dL — AB (ref 65–99)
GLUCOSE-CAPILLARY: 183 mg/dL — AB (ref 65–99)
GLUCOSE-CAPILLARY: 188 mg/dL — AB (ref 65–99)
GLUCOSE-CAPILLARY: 228 mg/dL — AB (ref 65–99)
GLUCOSE-CAPILLARY: 292 mg/dL — AB (ref 65–99)
GLUCOSE-CAPILLARY: 319 mg/dL — AB (ref 65–99)
GLUCOSE-CAPILLARY: 401 mg/dL — AB (ref 65–99)
GLUCOSE-CAPILLARY: 410 mg/dL — AB (ref 65–99)
Glucose-Capillary: 176 mg/dL — ABNORMAL HIGH (ref 65–99)
Glucose-Capillary: 190 mg/dL — ABNORMAL HIGH (ref 65–99)
Glucose-Capillary: 195 mg/dL — ABNORMAL HIGH (ref 65–99)
Glucose-Capillary: 319 mg/dL — ABNORMAL HIGH (ref 65–99)
Glucose-Capillary: 355 mg/dL — ABNORMAL HIGH (ref 65–99)
Glucose-Capillary: 366 mg/dL — ABNORMAL HIGH (ref 65–99)

## 2016-11-15 LAB — BLOOD GAS, ARTERIAL
Acid-base deficit: 1.9 mmol/L (ref 0.0–2.0)
Bicarbonate: 21 mmol/L (ref 20.0–28.0)
Drawn by: 31101
FIO2: 30
MECHVT: 620 mL
O2 Saturation: 98.3 %
PATIENT TEMPERATURE: 98.6
PCO2 ART: 28.1 mmHg — AB (ref 32.0–48.0)
PEEP: 5 cmH2O
PO2 ART: 126 mmHg — AB (ref 83.0–108.0)
RATE: 18 resp/min
pH, Arterial: 7.486 — ABNORMAL HIGH (ref 7.350–7.450)

## 2016-11-15 LAB — CBC
HCT: 41 % (ref 39.0–52.0)
HEMOGLOBIN: 12.7 g/dL — AB (ref 13.0–17.0)
MCH: 28.9 pg (ref 26.0–34.0)
MCHC: 31 g/dL (ref 30.0–36.0)
MCV: 93.2 fL (ref 78.0–100.0)
PLATELETS: 116 10*3/uL — AB (ref 150–400)
RBC: 4.4 MIL/uL (ref 4.22–5.81)
RDW: 16.7 % — ABNORMAL HIGH (ref 11.5–15.5)
WBC: 15.7 10*3/uL — ABNORMAL HIGH (ref 4.0–10.5)

## 2016-11-15 LAB — BASIC METABOLIC PANEL
Anion gap: 7 (ref 5–15)
BUN: 80 mg/dL — ABNORMAL HIGH (ref 6–20)
CALCIUM: 9.3 mg/dL (ref 8.9–10.3)
CO2: 23 mmol/L (ref 22–32)
CREATININE: 2.57 mg/dL — AB (ref 0.61–1.24)
Chloride: 127 mmol/L — ABNORMAL HIGH (ref 101–111)
GFR calc Af Amer: 30 mL/min — ABNORMAL LOW (ref >60)
GFR calc non Af Amer: 26 mL/min — ABNORMAL LOW (ref >60)
GLUCOSE: 402 mg/dL — AB (ref 65–99)
Potassium: 3.8 mmol/L (ref 3.5–5.1)
Sodium: 157 mmol/L — ABNORMAL HIGH (ref 135–145)

## 2016-11-15 LAB — PHOSPHORUS: Phosphorus: 3.8 mg/dL (ref 2.5–4.6)

## 2016-11-15 LAB — MAGNESIUM: Magnesium: 2.4 mg/dL (ref 1.7–2.4)

## 2016-11-15 LAB — CK: CK TOTAL: 1573 U/L — AB (ref 49–397)

## 2016-11-15 MED ORDER — SODIUM CHLORIDE 0.9 % IV SOLN
INTRAVENOUS | Status: DC
  Administered 2016-11-15: 3.5 [IU]/h via INTRAVENOUS
  Filled 2016-11-15: qty 2.5

## 2016-11-15 NOTE — Progress Notes (Signed)
Neurology Progress Note  Subjective: No major 24 hour events. He remains unresponsive in the ICU. He is not able to provide ROS as he is intubated and unresponsive. Per PCCM notes, family wishes to proceed with aggressive measures and trach/PEG are planned for 12/1.   Current Meds:   Current Facility-Administered Medications:  .  albuterol (PROVENTIL) (2.5 MG/3ML) 0.083% nebulizer solution 2.5 mg, 2.5 mg, Nebulization, Q2H PRN, Marijean Heath, NP .  chlorhexidine gluconate (MEDLINE KIT) (PERIDEX) 0.12 % solution 15 mL, 15 mL, Mouth Rinse, BID, Raylene Miyamoto, MD, 15 mL at 11/15/16 0728 .  dextrose 5 % solution, , Intravenous, Continuous, Omar Person, NP, Last Rate: 50 mL/hr at 11/15/16 0600 .  famotidine (PEPCID) IVPB 20 mg premix, 20 mg, Intravenous, Q24H, Jose Angelo A Corrie Dandy, MD, 20 mg at 11/15/16 0845 .  feeding supplement (PRO-STAT SUGAR FREE 64) liquid 60 mL, 60 mL, Oral, QID, Albert Lea, MD, 60 mL at 11/15/16 1216 .  feeding supplement (VITAL HIGH PROTEIN) liquid 1,000 mL, 1,000 mL, Per Tube, Q24H, Nicolette Bang, DO, 1,000 mL at 11/15/16 1511 .  fentaNYL (SUBLIMAZE) injection 100 mcg, 100 mcg, Intravenous, Q15 min PRN, Duffy Bruce, MD, 100 mcg at 11/10/16 2221 .  fentaNYL (SUBLIMAZE) injection 100 mcg, 100 mcg, Intravenous, Q2H PRN, Duffy Bruce, MD, 100 mcg at 11/14/16 1801 .  free water 300 mL, 300 mL, Per Tube, Q4H, Rush Farmer, MD, 300 mL at 11/15/16 1615 .  heparin injection 5,000 Units, 5,000 Units, Subcutaneous, Q8H, Marijean Heath, NP, 5,000 Units at 11/15/16 1216 .  insulin glargine (LANTUS) injection 30 Units, 30 Units, Subcutaneous, Daily, Omar Person, NP, 30 Units at 11/15/16 0845 .  insulin regular (NOVOLIN R,HUMULIN R) 250 Units in sodium chloride 0.9 % 250 mL (1 Units/mL) infusion, , Intravenous, Continuous, Rush Farmer, MD, Last Rate: 4.6 mL/hr at 11/15/16 1510, 4.6 Units/hr at 11/15/16 1510 .  MEDLINE mouth  rinse, 15 mL, Mouth Rinse, QID, Raylene Miyamoto, MD, 15 mL at 11/15/16 1511 .  metoprolol (LOPRESSOR) injection 5 mg, 5 mg, Intravenous, Q4H PRN, Omar Person, NP, 5 mg at 11/13/16 1720 .  metoprolol tartrate (LOPRESSOR) 25 mg/10 mL oral suspension 12.5 mg, 12.5 mg, Oral, BID, Omar Person, NP, 12.5 mg at 11/15/16 0845 .  sodium chloride flush (NS) 0.9 % injection 10-40 mL, 10-40 mL, Intracatheter, Q12H, Raylene Miyamoto, MD, 20 mL at 11/15/16 1000 .  sodium chloride flush (NS) 0.9 % injection 10-40 mL, 10-40 mL, Intracatheter, PRN, Raylene Miyamoto, MD  Objective:  Temp:  [98.4 F (36.9 C)-100.4 F (38 C)] 99 F (37.2 C) (11/29 1500) Pulse Rate:  [105-148] 117 (11/29 1537) Resp:  [13-39] 29 (11/29 1537) BP: (132-156)/(77-102) 142/84 (11/29 1500) SpO2:  [94 %-98 %] 97 % (11/29 1537) FiO2 (%):  [30 %] 30 % (11/29 1537) Weight:  [105.3 kg (232 lb 2.3 oz)] 105.3 kg (232 lb 2.3 oz) (11/29 0431)  General: WD obese AA man lying in ICU bed. He is unresponsive to verbal, tactile, and noxious stimuli. He has some partial eye opening to noise and noxious stimulation. He does not turn towards or localize the source of sound/pain. He does not follow any commands. HEENT: Neck is supple without lymphadenopathy. ETT, OGT in place. There is left corneal scarring and the right eye shows conjunctival tissue extending over the sclera.  CV: Distant, tachy, regular, no obvious murmur. Carotid pulses are 2+ and symmetric with  no bruits. Distal pulses 2+ and symmetric.  Lungs: Clear but distant due to body habitus.   Neuro: MS: As noted above. CN: Pupils are not visible due to baseline ocular pathology. He is blind at baseline. Corneals are intact. His face is grossly symmetric but partly obscured by tubes and tape. The remainder of his cranial nerves cannot be accurately assessed as he is not able to participate with the exam.  Motor: Normal bulk. Tone is decreased throughout. No spontaneous  or purposeful movements are observed. No tremor or other abnormal movements are observed.  Sensation: He has some triple flexion to nailbed pressure in the RLE with weak flexion in the RUE. No response to nailbed pressure on the L. He moves his head to central pain but does not localize.   DTRs: Absent throughout. Toes are mute bilaterally.  Coordination and gait: These cannot be assessed as the patient is not able to participate with the exam.   Labs: Lab Results  Component Value Date   WBC 15.7 (H) 11/15/2016   HGB 12.7 (L) 11/15/2016   HCT 41.0 11/15/2016   PLT 116 (L) 11/15/2016   GLUCOSE 402 (H) 11/15/2016   TRIG 149 11/09/2016   ALT 190 (H) 11/12/2016   AST 217 (H) 11/12/2016   NA 157 (H) 11/15/2016   K 3.8 11/15/2016   CL 127 (H) 11/15/2016   CREATININE 2.57 (H) 11/15/2016   BUN 80 (H) 11/15/2016   CO2 23 11/15/2016   TSH 2.43 01/29/2015   HGBA1C 7.9 10/09/2016   MICROALBUR 4.3 (H) 01/29/2015   CBC Latest Ref Rng & Units 11/15/2016 11/14/2016 11/13/2016  WBC 4.0 - 10.5 K/uL 15.7(H) 14.0(H) 13.1(H)  Hemoglobin 13.0 - 17.0 g/dL 12.7(L) 12.7(L) 12.6(L)  Hematocrit 39.0 - 52.0 % 41.0 39.9 39.4  Platelets 150 - 400 K/uL 116(L) 114(L) 123(L)    Lab Results  Component Value Date   HGBA1C 7.9 10/09/2016   Lab Results  Component Value Date   ALT 190 (H) 11/12/2016   AST 217 (H) 11/12/2016   ALKPHOS 113 11/12/2016   BILITOT 1.0 11/12/2016   Phos 4.3 Mg 2.5 CK 2472  Radiology: There is no new neuroimaging for review.   Other diagnostic studies:  EEG 11/14/16 showed diffuse background suppression with lack of reactivity to noxious stimulation.   A/P:   1. Hypoxic-ischemic (anoxic) brain injury: Presentation is consistent with severe hypoxic-ischemic injury caused by extreme and likely prolonged hypoglycemia. This is further compounded by respiratory failure with aspiration pneumonia. MRI of the brain did not reveal any acute abnormalities but this does not exclude  hypoglycemic injury. At this point, treatment is supportive as noted below.  2. Hypoxic-ischemic (anoxic) encephalopathy: This is acute, likely due to prolonged severe hypoglycemia. Encephalopathy may be further compounded by respiratory failure, aspiration pneumonia, hypernatremia, renal failure, liver dysfunction, and medication effect. He has been off sedation for over four days at this point with no improvement in his neurologic status. EEG is consistent with severe global brain injury and suggests poor prognosis for meaningful neurologic recovery, as does his clinical exam. Continue supportive care--family wants trach/PEG. Minimize CNS active medications, specifically opiates, benzodiazepines, and anything with strong anticholinergic properties. Continue to optimize metabolic status as you are.  At this time I have no additional recommendations and will sign off. Please call if any new issues should arise with which I may be of assistance.   This patient is critically ill and at significant risk of neurological worsening, death and care requires  constant monitoring of vital signs, hemodynamics,respiratory and cardiac monitoring, neurological assessment, discussion with family, other specialists and medical decision making of high complexity. A total of 30 minutes of critical care time was spent on this case.    Melba Coon, MD [facility]

## 2016-11-15 NOTE — Progress Notes (Signed)
PULMONARY / CRITICAL CARE MEDICINE   Name: Andrew [name] MRN: [id number] DOB: 11-Mar-1956    ADMISSION DATE:  11/08/2016  REFERRING MD:  EDP   CHIEF COMPLAINT:  AMS, respiratory failure, aspiration   Brief:   60 yo male with hx IDDM, GERD, HTN, OSA presented 11/22 after being found unresponsive at home for unknown period of time.  He lives with his wife but she has been in SNF for the last month.  Was last seen normal 2 days prior although family is trying to determine if his home health aide saw him in the interim.  Was found to have glucose 21, no improvement in mental status with D50.  He was bagged by EMS en route and had multiple episodes of vomiting with obvious aspiration.  Intubated on arrival to ER and PCCM called to admit.  Of note, daughter concerned that he has been very depressed the last few weeks.  There were family pictures strewn about the house when family arrived.  He has had little to no PO intake but continues to take his insulin per family.      SUBJECTIVE:  No events overnight. No improvement in neuro status  VITAL SIGNS: BP 135/88   Pulse (!) 106   Temp 98.6 F (37 C)   Resp (!) 24   Ht 6' (1.829 m)   Wt 105.3 kg (232 lb 2.3 oz)   SpO2 96%   BMI 31.48 kg/m   HEMODYNAMICS:    VENTILATOR SETTINGS: Vent Mode: CPAP;PSV FiO2 (%):  [30 %] 30 % Set Rate:  [18 bmp] 18 bmp Vt Set:  [409[620 mL] 620 mL PEEP:  [5 cmH20] 5 cmH20 Pressure Support:  [10 cmH20] 10 cmH20 Plateau Pressure:  [21 cmH20] 21 cmH20  INTAKE / OUTPUT: I/O last 3 completed shifts: In: 4011.7 [I.V.:1221.7; NG/GT:2790] Out: 5020 [Urine:4720; Stool:300]  PHYSICAL EXAMINATION: General:  Chronically ill appearing male, on vent  Neuro: pos gag, with painful stimulation withdraws right leg   HEENT:  Pupils are unable to be assessed as cataracts obstruct view, ETT  Cardiovascular:  S1s2, tachy, generalized edema  Lungs:  Unlabored, diminished breath sounds, on vent  Abdomen:  Round, soft,  hypoactive bs  Musculoskeletal:  Warm and dry, venous stasis   LABS:  BMET  Recent Labs Lab 11/13/16 1700 11/14/16 0405 11/15/16 0540  NA 157* 159* 157*  K 3.4* 3.7 3.8  CL 126* 126* 127*  CO2 23 24 23   BUN 75* 81* 80*  CREATININE 2.65* 2.60* 2.57*  GLUCOSE 306* 304* 402*    Electrolytes  Recent Labs Lab 11/13/16 0353 11/13/16 1700 11/14/16 0405 11/15/16 0540  CALCIUM 9.1 9.1 9.4 9.3  MG 2.6*  --  2.5* 2.4  PHOS 2.6  --  4.3 3.8    CBC  Recent Labs Lab 11/13/16 0353 11/14/16 0405 11/15/16 0540  WBC 13.1* 14.0* 15.7*  HGB 12.6* 12.7* 12.7*  HCT 39.4 39.9 41.0  PLT 123* 114* 116*    Coag's No results for input(s): APTT, INR in the last 168 hours.  Sepsis Markers  Recent Labs Lab 11/08/16 1330  11/08/16 1341 11/08/16 1656 11/08/16 2338  LATICACIDVEN  --   < > 3.91* 3.8* 3.9*  PROCALCITON 7.39  --   --   --   --   < > = values in this interval not displayed.  ABG  Recent Labs Lab 11/08/16 1146 11/09/16 0549 11/15/16 0354  PHART 7.404 7.431 7.486*  PCO2ART 37.2 28.8* 28.1*  PO2ART  192.0* 78.1* 126*    Liver Enzymes  Recent Labs Lab 11/10/16 0420 11/11/16 0445 11/12/16 0355  AST 463* 337* 217*  ALT 193* 204* 190*  ALKPHOS 68 89 113  BILITOT 1.1 1.8* 1.0  ALBUMIN 2.1* 2.1* 2.0*    Cardiac Enzymes No results for input(s): TROPONINI, PROBNP in the last 168 hours.  Glucose  Recent Labs Lab 11/14/16 1215 11/14/16 1552 11/14/16 1958 11/15/16 0032 11/15/16 0401 11/15/16 0753  GLUCAP 301* 302* 293* 366* 319* 355*    Imaging   STUDIES:  EEG 11/22 > severe diffuse dysfxn Ct head 11/22 > neg acute  MRI Head 11/27 > Mild atrophy and white matter disease is slightly advanced for age. This likely reflects the sequela of chronic microvascular ischemia  CULTURES: Sputum 11/22>>> (-) MRSA 11/22 > (-) Blood 11/22 > (-) Blood 11/29 >  ANTIBIOTICS: Zosyn 11/22>>> 11/27  SIGNIFICANT EVENTS: 11/22 admit after being found  unresponsive. Hypogly. proloned down time.   LINES/TUBES: ETT 11/22>>>  DISCUSSION: 60 yo male with severe hypoglycemia and persistent AMS and acute respiratory failure with obvious aspiration.    ASSESSMENT / PLAN:  PULMONARY Acute hypoxemic respiratory failure  2/2 unable to protect airway and asp pna. Possible HCAP.  P:   Vent support - 8cc/kg  Mental status is barrier to weaning  Plan to trach on Friday 12/1  CARDIOVASCULAR Hx HTN  Demand ischemia Tachycardia  P:  Hold home cozaar Continue Metoprolol 12.5 BID  Trend BP  Cardiac Monitoring   RENAL AKI - mild  Severe Rhabdo. Improving.  Hypernatremia P:   Replace Electrolytes as needed  Free water 300 Q4H D5 water at 1550ml/hr  Trend CK Total   GASTROINTESTINAL Elevated transaminases - down-trending   P:   Trend LFT's  TF pepcid   HEMATOLOGIC No active issue  P:  Trend CBC  SQ heparin   INFECTIOUS Aspiration PNA - will cover nosocomial as he has been visiting his wife frequently in SNF the last month P:     Trend WBC and Fever Curve  Repeat BC - (Fever overnight and increase in WBC)  ENDOCRINE IDDM  Severe hypoglycemia -- unclear if suicide attempt given depression s/s, pictures strewn about the home . CBG better.  P:   cont TF   SSI and Lantus and TF coverage   NEUROLOGIC AMS - r/t severe hypoglycemia with unknown down time.  Last seen normal 2 days prior to admit -EEG c/w severe cerebral injury. -Poor prognosis need family goals of care  P:   Neurology following  RASS goal: 0 PRN fentanyl  FAMILY  - Updates:  Daughter and wife updated 11/29. They want to do everything possible, including a trach.   - Inter-disciplinary family meet or Palliative Care meeting due by:  11/29  Critical care time with this patient today : 32 minutes.   [name], AG-ACNP [facility]  Pgr: [phone]  PCCM Pgr: [phone]  Attending Note:  60 year old male with severe  hypoglycemic encephalopathy with whom's family I spoke yesterday and they are insistent on full support.  On exam, remains unresponsive with frequent myoclonus.  I reviewed cxr myself, ETT ok.  Family still wishes for full support.  Will plan on trach on Friday with G-tube as well.  Continue to wean to facilitate placement post Trach/peg.  Consult diabetic coordinator for insulin.  Continue D5 for hypernatremia and AM labs ordered.  The patient is critically ill with multiple organ systems failure and  requires high complexity decision making for assessment and support, frequent evaluation and titration of therapies, application of advanced monitoring technologies and extensive interpretation of multiple databases.   Critical Care Time devoted to patient care services described in this note is  35  Minutes. This time reflects time of care of this signee Dr Jennet Maduro. This critical care time does not reflect procedure time, or teaching time or supervisory time of PA/NP/Med student/Med Resident etc but could involve care discussion time.  Andrew [name], M.D. [facility]monary/Critical Care Medicine. Pager: [phone]. After hours pager: [phone].

## 2016-11-15 NOTE — Care Management Important Message (Signed)
Important Message  Patient Details  Name: Andrew NeedsJohnny R Frieden MRN: [id number] Date of Birth: 01-08-1956   Medicare Important Message Given:  Yes    Kemaya Dorner [name] 11/15/2016, 10:20 AM

## 2016-11-15 NOTE — Progress Notes (Signed)
Inpatient Diabetes Program Recommendations  AACE/ADA: New Consensus Statement on Inpatient Glycemic Control (2015)  Target Ranges:  Prepandial:   less than 140 mg/dL      Peak postprandial:   less than 180 mg/dL (1-2 hours)      Critically ill patients:  140 - 180 mg/dL   Lab Results  Component Value Date   GLUCAP 355 (H) 11/15/2016   HGBA1C 7.9 10/09/2016    Review of Glycemic Control:  Results for Andrew [name], [name] (MRN [id number]) as of 11/15/2016 09:53  Ref. Range 11/14/2016 15:52 11/14/2016 19:58 11/15/2016 00:32 11/15/2016 04:01 11/15/2016 07:53  Glucose-Capillary Latest Ref Range: 65 - 99 mg/dL 952302 (H) 841293 (H) 324366 (H) 319 (H) 355 (H)   Inpatient Diabetes Program Recommendations:   Blood sugars remain elevated May consider ICU glycemic control order set- IV insulin.   Thanks, Beryl MeagerJenny Keylen Eckenrode, RN, BC-ADM Inpatient Diabetes Coordinator Pager [phone] (8a-5p)

## 2016-11-16 ENCOUNTER — Inpatient Hospital Stay (HOSPITAL_COMMUNITY): Payer: Medicare Other

## 2016-11-16 LAB — CBC
HEMATOCRIT: 41.4 % (ref 39.0–52.0)
Hemoglobin: 12.8 g/dL — ABNORMAL LOW (ref 13.0–17.0)
MCH: 28.8 pg (ref 26.0–34.0)
MCHC: 30.9 g/dL (ref 30.0–36.0)
MCV: 93.2 fL (ref 78.0–100.0)
PLATELETS: 129 10*3/uL — AB (ref 150–400)
RBC: 4.44 MIL/uL (ref 4.22–5.81)
RDW: 16.7 % — ABNORMAL HIGH (ref 11.5–15.5)
WBC: 16.8 10*3/uL — AB (ref 4.0–10.5)

## 2016-11-16 LAB — BLOOD CULTURE ID PANEL (REFLEXED)
Acinetobacter baumannii: NOT DETECTED
CANDIDA ALBICANS: NOT DETECTED
CANDIDA GLABRATA: NOT DETECTED
CANDIDA KRUSEI: NOT DETECTED
CANDIDA PARAPSILOSIS: NOT DETECTED
CANDIDA TROPICALIS: NOT DETECTED
ENTEROBACTER CLOACAE COMPLEX: NOT DETECTED
ENTEROBACTERIACEAE SPECIES: NOT DETECTED
ESCHERICHIA COLI: NOT DETECTED
Enterococcus species: NOT DETECTED
Haemophilus influenzae: NOT DETECTED
KLEBSIELLA OXYTOCA: NOT DETECTED
KLEBSIELLA PNEUMONIAE: NOT DETECTED
Listeria monocytogenes: NOT DETECTED
Methicillin resistance: DETECTED — AB
Neisseria meningitidis: NOT DETECTED
Proteus species: NOT DETECTED
Pseudomonas aeruginosa: NOT DETECTED
STREPTOCOCCUS PYOGENES: NOT DETECTED
Serratia marcescens: NOT DETECTED
Staphylococcus aureus (BCID): NOT DETECTED
Staphylococcus species: DETECTED — AB
Streptococcus agalactiae: NOT DETECTED
Streptococcus pneumoniae: NOT DETECTED
Streptococcus species: NOT DETECTED

## 2016-11-16 LAB — GLUCOSE, CAPILLARY
GLUCOSE-CAPILLARY: 112 mg/dL — AB (ref 65–99)
GLUCOSE-CAPILLARY: 113 mg/dL — AB (ref 65–99)
GLUCOSE-CAPILLARY: 142 mg/dL — AB (ref 65–99)
GLUCOSE-CAPILLARY: 258 mg/dL — AB (ref 65–99)
GLUCOSE-CAPILLARY: 324 mg/dL — AB (ref 65–99)
Glucose-Capillary: 131 mg/dL — ABNORMAL HIGH (ref 65–99)
Glucose-Capillary: 162 mg/dL — ABNORMAL HIGH (ref 65–99)
Glucose-Capillary: 284 mg/dL — ABNORMAL HIGH (ref 65–99)
Glucose-Capillary: 405 mg/dL — ABNORMAL HIGH (ref 65–99)
Glucose-Capillary: 213 mg/dL — ABNORMAL HIGH (ref 65–99)

## 2016-11-16 LAB — BASIC METABOLIC PANEL
Anion gap: 8 (ref 5–15)
BUN: 76 mg/dL — ABNORMAL HIGH (ref 6–20)
CHLORIDE: 129 mmol/L — AB (ref 101–111)
CO2: 23 mmol/L (ref 22–32)
CREATININE: 2.35 mg/dL — AB (ref 0.61–1.24)
Calcium: 9.4 mg/dL (ref 8.9–10.3)
GFR, EST AFRICAN AMERICAN: 33 mL/min — AB (ref >60)
GFR, EST NON AFRICAN AMERICAN: 29 mL/min — AB (ref >60)
Glucose, Bld: 144 mg/dL — ABNORMAL HIGH (ref 65–99)
POTASSIUM: 3.4 mmol/L — AB (ref 3.5–5.1)
SODIUM: 160 mmol/L — AB (ref 135–145)

## 2016-11-16 LAB — MAGNESIUM: Magnesium: 2.4 mg/dL (ref 1.7–2.4)

## 2016-11-16 LAB — CK: Total CK: 1566 U/L — ABNORMAL HIGH (ref 49–397)

## 2016-11-16 LAB — PHOSPHORUS: PHOSPHORUS: 4.2 mg/dL (ref 2.5–4.6)

## 2016-11-16 MED ORDER — ETOMIDATE 2 MG/ML IV SOLN
40.0000 mg | Freq: Once | INTRAVENOUS | Status: DC
  Filled 2016-11-16: qty 20

## 2016-11-16 MED ORDER — POTASSIUM CHLORIDE 20 MEQ/15ML (10%) PO SOLN
40.0000 meq | Freq: Once | ORAL | Status: AC
  Administered 2016-11-16: 40 meq
  Filled 2016-11-16: qty 30

## 2016-11-16 MED ORDER — INSULIN ASPART 100 UNIT/ML ~~LOC~~ SOLN
11.0000 [IU] | Freq: Once | SUBCUTANEOUS | Status: AC
  Administered 2016-11-16: 11 [IU] via SUBCUTANEOUS

## 2016-11-16 MED ORDER — VANCOMYCIN HCL 10 G IV SOLR
1750.0000 mg | Freq: Once | INTRAVENOUS | Status: AC
  Administered 2016-11-16: 1750 mg via INTRAVENOUS
  Filled 2016-11-16: qty 1750

## 2016-11-16 MED ORDER — VECURONIUM BROMIDE 10 MG IV SOLR: 10.0000 mg | Freq: Once | INTRAVENOUS | Status: DC

## 2016-11-16 MED ORDER — PROPOFOL 500 MG/50ML IV EMUL: 5.0000 ug/kg/min | Freq: Once | INTRAVENOUS | Status: DC

## 2016-11-16 MED ORDER — INSULIN ASPART 100 UNIT/ML ~~LOC~~ SOLN
0.0000 [IU] | SUBCUTANEOUS | Status: DC
  Administered 2016-11-16: 5 [IU] via SUBCUTANEOUS
  Administered 2016-11-16: 2 [IU] via SUBCUTANEOUS
  Administered 2016-11-16: 7 [IU] via SUBCUTANEOUS
  Administered 2016-11-16: 5 [IU] via SUBCUTANEOUS
  Administered 2016-11-17 (×3): 3 [IU] via SUBCUTANEOUS
  Administered 2016-11-17: 5 [IU] via SUBCUTANEOUS
  Administered 2016-11-17: 3 [IU] via SUBCUTANEOUS
  Administered 2016-11-17 – 2016-11-18 (×6): 2 [IU] via SUBCUTANEOUS
  Administered 2016-11-19: 3 [IU] via SUBCUTANEOUS
  Administered 2016-11-19: 7 [IU] via SUBCUTANEOUS
  Administered 2016-11-19: 5 [IU] via SUBCUTANEOUS
  Administered 2016-11-19: 3 [IU] via SUBCUTANEOUS

## 2016-11-16 MED ORDER — INSULIN ASPART 100 UNIT/ML ~~LOC~~ SOLN
4.0000 [IU] | SUBCUTANEOUS | Status: DC
  Administered 2016-11-16 – 2016-11-20 (×19): 4 [IU] via SUBCUTANEOUS

## 2016-11-16 MED ORDER — MIDAZOLAM HCL 2 MG/2ML IJ SOLN: 4.0000 mg | Freq: Once | INTRAMUSCULAR | Status: DC

## 2016-11-16 MED ORDER — ALTEPLASE 2 MG IJ SOLR
2.0000 mg | Freq: Once | INTRAMUSCULAR | Status: AC
  Administered 2016-11-16: 2 mg
  Filled 2016-11-16: qty 2

## 2016-11-16 MED ORDER — FENTANYL CITRATE (PF) 100 MCG/2ML IJ SOLN: 200.0000 ug | Freq: Once | INTRAMUSCULAR | Status: DC

## 2016-11-16 MED ORDER — VANCOMYCIN HCL 10 G IV SOLR
1250.0000 mg | INTRAVENOUS | Status: DC
  Administered 2016-11-17 – 2016-11-18 (×2): 1250 mg via INTRAVENOUS
  Filled 2016-11-16 (×3): qty 1250

## 2016-11-16 MED ORDER — FREE WATER
400.0000 mL | Status: DC
  Administered 2016-11-16 – 2016-11-24 (×38): 400 mL

## 2016-11-16 NOTE — Progress Notes (Signed)
PULMONARY / CRITICAL CARE MEDICINE   Name: Andrew [name] MRN: [id number] DOB: 04/08/1956    ADMISSION DATE:  11/08/2016  REFERRING MD:  EDP   CHIEF COMPLAINT:  AMS, respiratory failure, aspiration   Brief:   60 yo male with hx IDDM, GERD, HTN, OSA presented 11/22 after being found unresponsive at home for unknown period of time.  He lives with his wife but she has been in SNF for the last month.  Was last seen normal 2 days prior although family is trying to determine if his home health aide saw him in the interim.  Was found to have glucose 21, no improvement in mental status with D50.  He was bagged by EMS en route and had multiple episodes of vomiting with obvious aspiration.  Intubated on arrival to ER and PCCM called to admit.  Of note, daughter concerned that he has been very depressed the last few weeks.  There were family pictures strewn about the house when family arrived.  He has had little to no PO intake but continues to take his insulin per family.      SUBJECTIVE:  No events overnight. No improvement in neuro status  VITAL SIGNS: BP (!) 144/83   Pulse (!) 118   Temp 99 F (37.2 C)   Resp (!) 21   Ht 6' (1.829 m)   Wt 105.2 kg (231 lb 14.8 oz)   SpO2 96%   BMI 31.45 kg/m   HEMODYNAMICS:    VENTILATOR SETTINGS: Vent Mode: PRVC FiO2 (%):  [30 %-40 %] 40 % Set Rate:  [18 bmp] 18 bmp Vt Set:  [620 mL] 620 mL PEEP:  [5 cmH20] 5 cmH20 Pressure Support:  [10 cmH20] 10 cmH20 Plateau Pressure:  [13 cmH20-23 cmH20] 22 cmH20  INTAKE / OUTPUT: I/O last 3 completed shifts: In: 5721.2 [I.V.:1831.2; NG/GT:3790; IV Piggyback:100] Out: 6295 [MWUXL:[id number] [Urine:4440; Stool:625]  PHYSICAL EXAMINATION: General:  Chronically ill appearing male, on vent  Neuro: pos gag, with painful stimulation withdraws right side  HEENT:  Pupils are unable to be assessed as cataracts obstruct view, ETT  Cardiovascular:  S1s2, tachy, generalized edema  Lungs:  Unlabored, diminished breath sounds, on  vent  Abdomen:  Round, soft, hypoactive bs  Musculoskeletal:  Warm and dry, venous stasis   LABS:  BMET  Recent Labs Lab 11/14/16 0405 11/15/16 0540 11/16/16 0344  NA 159* 157* 160*  K 3.7 3.8 3.4*  CL 126* 127* 129*  CO2 24 23 23   BUN 81* 80* 76*  CREATININE 2.60* 2.57* 2.35*  GLUCOSE 304* 402* 144*    Electrolytes  Recent Labs Lab 11/14/16 0405 11/15/16 0540 11/16/16 0344  CALCIUM 9.4 9.3 9.4  MG 2.5* 2.4 2.4  PHOS 4.3 3.8 4.2    CBC  Recent Labs Lab 11/14/16 0405 11/15/16 0540 11/16/16 0344  WBC 14.0* 15.7* 16.8*  HGB 12.7* 12.7* 12.8*  HCT 39.9 41.0 41.4  PLT 114* 116* 129*    Coag's No results for input(s): APTT, INR in the last 168 hours.  Sepsis Markers No results for input(s): LATICACIDVEN, PROCALCITON, O2SATVEN in the last 168 hours.  ABG  Recent Labs Lab 11/15/16 0354  PHART 7.486*  PCO2ART 28.1*  PO2ART 126*    Liver Enzymes  Recent Labs Lab 11/10/16 0420 11/11/16 0445 11/12/16 0355  AST 463* 337* 217*  ALT 193* 204* 190*  ALKPHOS 68 89 113  BILITOT 1.1 1.8* 1.0  ALBUMIN 2.1* 2.1* 2.0*    Cardiac Enzymes No results for input(s):  TROPONINI, PROBNP in the last 168 hours.  Glucose  Recent Labs Lab 11/16/16 0028 11/16/16 0127 11/16/16 0223 11/16/16 0331 11/16/16 0433 11/16/16 0811  GLUCAP 142* 113* 131* 112* 162* 258*    Imaging   STUDIES:  EEG 11/22 > severe diffuse dysfxn Ct head 11/22 > neg acute  MRI Head 11/27 > Mild atrophy and white matter disease is slightly advanced for age. This likely reflects the sequela of chronic microvascular ischemia  CULTURES: Sputum 11/22>>> (-) MRSA 11/22 > (-) Blood 11/22 > (-) Blood 11/29 > aerobic bottle + for gpc clusters   ANTIBIOTICS: Zosyn 11/22>>> 11/27 Vancomycin > 11/30  SIGNIFICANT EVENTS: 11/22 admit after being found unresponsive. Hypogly. proloned down time.   LINES/TUBES: ETT 11/22>>>  DISCUSSION: 60 yo male with severe hypoglycemia and  persistent AMS and acute respiratory failure with obvious aspiration.    ASSESSMENT / PLAN:  PULMONARY Acute hypoxemic respiratory failure  2/2 unable to protect airway and asp pna. Possible HCAP.  P:   Vent support - 8cc/kg  Mental status is barrier to weaning  Plan to trach on Friday 12/1  CARDIOVASCULAR Hx HTN  Demand ischemia Tachycardia  P:  Hold home cozaar Continue Metoprolol 12.5 BID  Trend BP  Cardiac Monitoring   RENAL AKI - mild  Severe Rhabdo. Improving.  Hypernatremia P:   Replace Electrolytes as needed  Free water 400 Q4H D5 water at 7050ml/hr  Trend CK Total   GASTROINTESTINAL Elevated transaminases - down-trending   P:   Trend LFT's  TF pepcid  PEG tube per IR   HEMATOLOGIC No active issue  P:  Trend CBC   SQ heparin   INFECTIOUS Aspiration PNA - will cover nosocomial as he has been visiting his wife frequently in SNF the last month -BC positive for gpc clusters  P:     Trend WBC and Fever Curve  Add Vanco Repeat BC   ENDOCRINE IDDM  Severe hypoglycemia -- unclear if suicide attempt given depression s/s, pictures strewn about the home . CBG better.  P:   cont TF   SSI and Lantus and TF coverage   NEUROLOGIC AMS - r/t severe hypoglycemia with unknown down time.  Last seen normal 2 days prior to admit -EEG c/w severe cerebral injury. -Poor prognosis need family goals of care  P:   Neurology following  RASS goal: 0 PRN fentanyl  FAMILY  - Updates:  Daughter and wife updated 11/29. They want to do everything possible, including a trach.   - Inter-disciplinary family meet or Palliative Care meeting due by:  11/29  Critical care time with this patient today : 32 minutes.   Andrew [name], AG-ACNP [facility]  Pgr: [phone]  PCCM Pgr: [phone]  Attending Note:  60 year old male with severe hypoglycemic encephalopathy with whom's family I spoke yesterday and they are insistent on full support.   On exam, withdraws right leg to pain.  I reviewed cxr myself, ETT ok.  Family still wishes for full support.  Will plan on trach on Friday at 10 AM.  Continue to wean to facilitate placement post Trach/peg.  Spoke with family and got consent.  The patient is critically ill with multiple organ systems failure and requires high complexity decision making for assessment and support, frequent evaluation and titration of therapies, application of advanced monitoring technologies and extensive interpretation of multiple databases.   Critical Care Time devoted to patient care services described in this note is  8035  Minutes. This time reflects time of care of this signee Dr Koren Bound. This critical care time does not reflect procedure time, or teaching time or supervisory time of PA/NP/Med student/Med Resident etc but could involve care discussion time.  Alyson Reedy, M.D. [facility]. Pager: [phone]. After hours pager: [phone].

## 2016-11-16 NOTE — Progress Notes (Signed)
PHARMACY - PHYSICIAN COMMUNICATION CRITICAL VALUE ALERT - BLOOD CULTURE IDENTIFICATION (BCID)  Results for orders placed or performed during the hospital encounter of 11/08/16  Blood Culture ID Panel (Reflexed) (Collected: 11/15/2016 11:35 AM)  Result Value Ref Range   Enterococcus species NOT DETECTED NOT DETECTED   Listeria monocytogenes NOT DETECTED NOT DETECTED   Staphylococcus species DETECTED (A) NOT DETECTED   Staphylococcus aureus NOT DETECTED NOT DETECTED   Methicillin resistance DETECTED (A) NOT DETECTED   Streptococcus species NOT DETECTED NOT DETECTED   Streptococcus agalactiae NOT DETECTED NOT DETECTED   Streptococcus pneumoniae NOT DETECTED NOT DETECTED   Streptococcus pyogenes NOT DETECTED NOT DETECTED   Acinetobacter baumannii NOT DETECTED NOT DETECTED   Enterobacteriaceae species NOT DETECTED NOT DETECTED   Enterobacter cloacae complex NOT DETECTED NOT DETECTED   Escherichia coli NOT DETECTED NOT DETECTED   Klebsiella oxytoca NOT DETECTED NOT DETECTED   Klebsiella pneumoniae NOT DETECTED NOT DETECTED   Proteus species NOT DETECTED NOT DETECTED   Serratia marcescens NOT DETECTED NOT DETECTED   Haemophilus influenzae NOT DETECTED NOT DETECTED   Neisseria meningitidis NOT DETECTED NOT DETECTED   Pseudomonas aeruginosa NOT DETECTED NOT DETECTED   Candida albicans NOT DETECTED NOT DETECTED   Candida glabrata NOT DETECTED NOT DETECTED   Candida krusei NOT DETECTED NOT DETECTED   Candida parapsilosis NOT DETECTED NOT DETECTED   Candida tropicalis NOT DETECTED NOT DETECTED    Name of physician (or Provider) Contacted: [name], NP  Changes to prescribed antibiotics required: Vancomycin initiated this AM for positive blood cx 1/2. Although likely contaminant, patient with elevated WBCs today.   York CeriseKatherine Cook, PharmD Pharmacy Resident  Pager [phone] 11/16/16 10:02 AM

## 2016-11-16 NOTE — Progress Notes (Signed)
Nutrition Follow-up  DOCUMENTATION CODES:   Obesity unspecified  INTERVENTION:    Continue TF via OGT with Vital High Protein at goal rate of 20 ml/h (480 ml per day) and Prostat 60 ml QID to provide 1280 kcals, 162 gm protein, 401 ml free water daily.  NUTRITION DIAGNOSIS:   Inadequate oral intake related to inability to eat as evidenced by NPO status.  Ongoing  GOAL:   Provide needs based on ASPEN/SCCM guidelines  Met  MONITOR:   Vent status, Labs, I & O's  ASSESSMENT:   60 yo male admitted on 11/22 with severe hypoglycemia, persistent AMS, and acute respiratory failure with obvious aspiration.   Discussed patient in ICU rounds and with RN today. Plans for tracheostomy on Friday. Patient is currently intubated on ventilator support Temp (24hrs), Avg:98.9 F (37.2 C), Min:97.5 F (36.4 C), Max:99.5 F (37.5 C) Propofol off  Patient is currently receiving Vital High Protein via OGT at 20 ml/h (480 ml/day) with Prostat 60 ml QID to provide 1280 kcals, 162 gm protein, 401 ml free water daily.  TF is meeting 100% of estimated nutrition needs. Labs and medications reviewed.  Sodium 160, potassium 3.4 Free water increased to 400 ml every 4 hours CBG's: 162-258-284  Diet Order:  Diet NPO time specified  Skin:  Reviewed, no issues  Last BM:  11/22  Height:   Ht Readings from Last 1 Encounters:  11/08/16 6' (1.829 m)    Weight:   Wt Readings from Last 1 Encounters:  11/16/16 231 lb 14.8 oz (105.2 kg)    Ideal Body Weight:  80.9 kg  BMI:  Body mass index is 31.45 kg/m.  Estimated Nutritional Needs:   Kcal:  6720-9470  Protein:  162 gm  Fluid:  2 L  EDUCATION NEEDS:   No education needs identified at this time  Molli Barrows, [place], [place], [place] Pager [phone] After Hours Pager [phone]

## 2016-11-16 NOTE — Progress Notes (Signed)
eLink Physician-Brief Progress Note Patient Name: Andrew NeedsJohnny R [name] DOB: 1956/07/25 MRN: [id number]   Date of Service  11/16/2016  HPI/Events of Note  Hypokalemia and hypernatremia  eICU Interventions  Potassium replaced Increase in FW     Intervention Category Intermediate Interventions: Electrolyte abnormality - evaluation and management  Andrew [name] 11/16/2016, 6:04 AM

## 2016-11-16 NOTE — Progress Notes (Signed)
Placed pt back on rest mode d/t rr 40's.  Rn aware, preparing pt for CT scan.

## 2016-11-16 NOTE — Progress Notes (Signed)
Pharmacy Antibiotic Note  Andrew [name] is a 60 y.o. male admitted on 11/08/2016 with bacteremia.  Pharmacy has been consulted for vancomycin dosing.  Patient previously received zosyn for nosocomial aspiration pneumonia. Blood culture positive for gram positive cocci in clusters and elevated WBC of 16.8. Elevated SCr of 2.35 with estimated CrCl of ~42.4 mL/min.   Plan: Vancomycin 1750 mg x1 then 1250 mg q24h Monitor for renal function, cultures and sensitivity, and VT at SS  Height: 6' (182.9 cm) Weight: 231 lb 14.8 oz (105.2 kg) IBW/kg (Calculated) : 77.6  Temp (24hrs), Avg:99 F (37.2 C), Min:98.4 F (36.9 C), Max:99.5 F (37.5 C)   Recent Labs Lab 11/12/16 0355 11/13/16 0353 11/13/16 1700 11/14/16 0405 11/15/16 0540 11/16/16 0344  WBC 13.0* 13.1*  --  14.0* 15.7* 16.8*  CREATININE 2.38* 2.36* 2.65* 2.60* 2.57* 2.35*    Estimated Creatinine Clearance: 42.4 mL/min (by C-G formula based on SCr of 2.35 mg/dL (H)).    Allergies  Allergen Reactions  . Lisinopril Cough    cough    Antimicrobials this admission: 11/22 Zosyn >> 11/27 11/30 Vancomycin >>   Dose adjustments this admission:  Microbiology results: 11/22 Blood x 2: No growth 11/22 Urine: No growth 11/22 MRSA PCR: neg 11/29 Blood x2: Gram pos cocci in clusters   Andrew [name] 11/16/2016 9:18 AM

## 2016-11-17 ENCOUNTER — Other Ambulatory Visit (HOSPITAL_COMMUNITY): Payer: Self-pay | Admitting: Radiology

## 2016-11-17 ENCOUNTER — Inpatient Hospital Stay (HOSPITAL_COMMUNITY): Payer: Medicare Other

## 2016-11-17 LAB — BASIC METABOLIC PANEL
Anion gap: 10 (ref 5–15)
BUN: 70 mg/dL — ABNORMAL HIGH (ref 6–20)
CALCIUM: 9 mg/dL (ref 8.9–10.3)
CHLORIDE: 125 mmol/L — AB (ref 101–111)
CO2: 22 mmol/L (ref 22–32)
CREATININE: 2.46 mg/dL — AB (ref 0.61–1.24)
GFR calc non Af Amer: 27 mL/min — ABNORMAL LOW (ref >60)
GFR, EST AFRICAN AMERICAN: 31 mL/min — AB (ref >60)
GLUCOSE: 174 mg/dL — AB (ref 65–99)
Potassium: 3.6 mmol/L (ref 3.5–5.1)
Sodium: 157 mmol/L — ABNORMAL HIGH (ref 135–145)

## 2016-11-17 LAB — PHOSPHORUS: Phosphorus: 3.7 mg/dL (ref 2.5–4.6)

## 2016-11-17 LAB — GLUCOSE, CAPILLARY
GLUCOSE-CAPILLARY: 204 mg/dL — AB (ref 65–99)
GLUCOSE-CAPILLARY: 208 mg/dL — AB (ref 65–99)
GLUCOSE-CAPILLARY: 221 mg/dL — AB (ref 65–99)
GLUCOSE-CAPILLARY: 269 mg/dL — AB (ref 65–99)
Glucose-Capillary: 167 mg/dL — ABNORMAL HIGH (ref 65–99)
Glucose-Capillary: 246 mg/dL — ABNORMAL HIGH (ref 65–99)

## 2016-11-17 LAB — PROTIME-INR
INR: 1.24
PROTHROMBIN TIME: 15.7 s — AB (ref 11.4–15.2)

## 2016-11-17 LAB — CBC
HCT: 38.3 % — ABNORMAL LOW (ref 39.0–52.0)
HEMOGLOBIN: 12.1 g/dL — AB (ref 13.0–17.0)
MCH: 29.4 pg (ref 26.0–34.0)
MCHC: 31.6 g/dL (ref 30.0–36.0)
MCV: 93 fL (ref 78.0–100.0)
PLATELETS: 153 10*3/uL (ref 150–400)
RBC: 4.12 MIL/uL — AB (ref 4.22–5.81)
RDW: 16.4 % — ABNORMAL HIGH (ref 11.5–15.5)
WBC: 18.5 10*3/uL — ABNORMAL HIGH (ref 4.0–10.5)

## 2016-11-17 LAB — CK: Total CK: 1138 U/L — ABNORMAL HIGH (ref 49–397)

## 2016-11-17 LAB — MAGNESIUM: Magnesium: 2.3 mg/dL (ref 1.7–2.4)

## 2016-11-17 MED ORDER — FENTANYL CITRATE (PF) 100 MCG/2ML IJ SOLN: 200.0000 ug | Freq: Once | INTRAMUSCULAR | Status: AC

## 2016-11-17 MED ORDER — MIDAZOLAM HCL 2 MG/2ML IJ SOLN
INTRAMUSCULAR | Status: AC
  Filled 2016-11-17: qty 2

## 2016-11-17 MED ORDER — VECURONIUM BROMIDE 10 MG IV SOLR
INTRAVENOUS | Status: AC
  Filled 2016-11-17: qty 10

## 2016-11-17 MED ORDER — MIDAZOLAM HCL 2 MG/2ML IJ SOLN: 4.0000 mg | Freq: Once | INTRAMUSCULAR | Status: AC

## 2016-11-17 MED ORDER — PROPOFOL 500 MG/50ML IV EMUL: 5.0000 ug/kg/min | Freq: Once | INTRAVENOUS | Status: DC

## 2016-11-17 MED ORDER — PROPOFOL 500 MG/50ML IV EMUL
INTRAVENOUS | Status: AC
  Administered 2016-11-17: 20 ug/kg/min
  Filled 2016-11-17: qty 50

## 2016-11-17 MED ORDER — ETOMIDATE 2 MG/ML IV SOLN
INTRAVENOUS | Status: AC
  Filled 2016-11-17: qty 20

## 2016-11-17 MED ORDER — VECURONIUM BROMIDE 10 MG IV SOLR
10.0000 mg | Freq: Once | INTRAVENOUS | Status: AC
  Administered 2016-11-17: 10 mg via INTRAVENOUS

## 2016-11-17 MED ORDER — PROPOFOL 500 MG/50ML IV EMUL
INTRAVENOUS | Status: AC
  Filled 2016-11-17: qty 50

## 2016-11-17 MED ORDER — INSULIN GLARGINE 100 UNIT/ML ~~LOC~~ SOLN
25.0000 [IU] | Freq: Two times a day (BID) | SUBCUTANEOUS | Status: DC
  Administered 2016-11-17 – 2016-11-20 (×7): 25 [IU] via SUBCUTANEOUS
  Filled 2016-11-17 (×8): qty 0.25

## 2016-11-17 MED ORDER — METOPROLOL TARTRATE 25 MG/10 ML ORAL SUSPENSION
25.0000 mg | Freq: Two times a day (BID) | ORAL | Status: DC
  Administered 2016-11-18 – 2016-11-20 (×4): 25 mg via ORAL
  Filled 2016-11-17 (×6): qty 10

## 2016-11-17 MED ORDER — STERILE WATER FOR INJECTION IJ SOLN
INTRAMUSCULAR | Status: AC
  Filled 2016-11-17: qty 10

## 2016-11-17 MED ORDER — ETOMIDATE 2 MG/ML IV SOLN
40.0000 mg | Freq: Once | INTRAVENOUS | Status: DC
  Filled 2016-11-17: qty 20

## 2016-11-17 NOTE — Care Management Note (Addendum)
  Case Management Note  Patient Details  Name: Andrew NeedsJohnny R [name] MRN: [id number] Date of Birth: 1956-02-04  Subjective/Objective:     Pt admitted after being found unresponsive for unknown amount of time               Action/Plan:  PTA from home.  Wife recently stayed in SNF.  Per attending prognosis is very poor -  however when he spoke with family (wife and two daughters) at length they were insistent that withdraw is not an option.  Will plan on tracheostomy towards the end of the week and stay as full code.  CM will continue to follow for discharge [name]  Expected Discharge Date:                  Expected Discharge Plan:  Skilled Nursing Facility  In-House Referral:  Clinical Social Work  Discharge planning Services  CM Consult  Post Acute Care Choice:    Choice offered to:     DME Arranged:    DME Agency:     HH Arranged:    HH Agency:     Status of Service:  In process, will continue to follow  If discussed at Long Length of Stay Meetings, dates discussed:    Additional Comments: 11/17/2016  CM spoke with wife, daughter and brother in detail at bedside.  Family understands that if pt doesn't wean off ventilator he will need to placed outside of the state.  CM also reviewed the requirements from family for pt to return home with high supplemental oxgyen [name] - wife stated she would not be able to care for him in the home . Wife will remain in SNF until 11/29/16.  Current plan is to see how pt does over the weekend to determine appropriate discharge plan.    Discussed during LOS 11/16/16 - pt remains appropriate for continued stay and discharge plan with current trend is SNF.  Pt trached today per family request.  Per attending ; its not likely that pt will wean from ventilator due to AMS - r/t severe hypoglycemia with unknown down time,-EEG c/w severe cerebral injury. -Poor prognosis need family goals of care.  CSW consulted Cherylann ParrClaxton, [name], RN 11/17/2016, 11:10 AM

## 2016-11-17 NOTE — Procedures (Signed)
Bronchoscopy  for Percutaneous  Tracheostomy  Name: Andrew [name] R Whidden MRN: [id number] DOB: 1956/12/01 Procedure: Bronchoscopy for Percutaneous Tracheostomy Indications: Diagnostic evaluation of the airways In conjunction with: Dr. Molli KnockYacoub  Procedure Details Consent: Risks of procedure as well as the alternatives and risks of each were explained to the (patient/caregiver).  Consent for procedure obtained. Time Out: Verified patient identification, verified procedure, site/side was marked, verified correct patient position, special equipment/implants available, medications/allergies/relevent history reviewed, required imaging and test results available.  Performed  In preparation for procedure, patient was given 100% FiO2 and bronchoscope lubricated. Sedation: Benzodiazepines, Muscle relaxants and Etomidate  Airway entered and the following bronchi were examined: Trachea to carina.   Procedures performed: Endotracheal Tube retracted in 2 cm increments. Cannulation of airway observed. Dilation observed. Placement of trachel tube  observed . No overt complications. Bronchoscope removed.  , Patient placed back on 100% FiO2 at conclusion of procedure.    Evaluation Hemodynamic Status: BP stable throughout; O2 sats: stable throughout Patient's Current Condition: stable Specimens:  None Complications: No apparent complications Patient did tolerate procedure well.   Brett CanalesSteve Minor ACNP [facility] Pager [phone] till 3 pm If no answer page [phone] 11/17/2016, 10:51 AM  Alyson ReedyWesam G. Wyndham Santilli, M.D. [facility]. Pager: [phone]. After hours pager: [phone].

## 2016-11-17 NOTE — Procedures (Signed)
Bedside Tracheostomy Insertion Procedure Note   Patient Details:   Name: Andrew [name] DOB: 1956-05-20 MRN: [id number]  Procedure: Tracheostomy  Pre Procedure Assessment: ET Tube Size:8.0 ET Tube secured at lip (cm):22 Bite block in place: No Breath Sounds: Clear  Post Procedure Assessment: BP 102/67   Pulse (!) 120   Temp 99.5 F (37.5 C)   Resp 18   Ht 6' (1.829 m)   Wt 223 lb 5.2 oz (101.3 kg)   SpO2 95%   BMI 30.29 kg/m  O2 sats: stable throughout Complications: No apparent complications Patient did tolerate procedure well Tracheostomy Brand:Shiley Tracheostomy Style:Cuffed Tracheostomy Size: 6.0 cuffed Tracheostomy Secured NWG:[id number]:Sutures, velcro Tracheostomy Placement Confirmation:Trach cuff visualized and in place, cxr taken for placement    Charlcie Prisco Ann 11/17/2016, 11:25 AM

## 2016-11-17 NOTE — Progress Notes (Signed)
PULMONARY / CRITICAL CARE MEDICINE   Name: Andrew [name] MRN: [id number] DOB: 05/19/1956    ADMISSION DATE:  11/08/2016  REFERRING MD:  EDP   CHIEF COMPLAINT:  AMS, respiratory failure, aspiration   Brief:   60 yo male with hx IDDM, GERD, HTN, OSA presented 11/22 after being found unresponsive at home for unknown period of time.  He lives with his wife but she has been in SNF for the last month.  Was last seen normal 2 days prior although family is trying to determine if his home health aide saw him in the interim.  Was found to have glucose 21, no improvement in mental status with D50.  He was bagged by EMS en route and had multiple episodes of vomiting with obvious aspiration.  Intubated on arrival to ER and PCCM called to admit.  Of note, daughter concerned that he has been very depressed the last few weeks.  There were family pictures strewn about the house when family arrived.  He has had little to no PO intake but continues to take his insulin per family.      SUBJECTIVE:  No events overnight. No improvement in neuro status  VITAL SIGNS: BP (!) 138/40   Pulse (!) 119   Temp 99.9 F (37.7 C)   Resp (!) 24   Ht 6' (1.829 m)   Wt 101.3 kg (223 lb 5.2 oz)   SpO2 95%   BMI 30.29 kg/m   HEMODYNAMICS:    VENTILATOR SETTINGS: Vent Mode: PSV;CPAP FiO2 (%):  [30 %] 30 % Set Rate:  [18 bmp] 18 bmp Vt Set:  [620 mL] 620 mL PEEP:  [5 cmH20] 5 cmH20 Pressure Support:  [5 cmH20-10 cmH20] 10 cmH20 Plateau Pressure:  [19 cmH20-22 cmH20] 21 cmH20  INTAKE / OUTPUT: I/O last 3 completed shifts: In: 4662.6 [I.V.:1552.6; NG/GT:3110] Out: 4590 [Urine:3965; Stool:625]  PHYSICAL EXAMINATION: General:  Chronically ill appearing male, on vent  Neuro: pos gag, with painful stimulation withdraws right leg only  HEENT:  Pupils are unable to be assessed as cataracts (chronic blindness?) obstruct view, ETT  Cardiovascular:  S1s2, tachy, generalized edema  Lungs:  Unlabored, diminished  breath sounds, on vent  Abdomen:  Round, soft, active bs  Musculoskeletal:  Warm and dry, venous stasis   LABS:  BMET  Recent Labs Lab 11/15/16 0540 11/16/16 0344 11/17/16 0500  NA 157* 160* 157*  K 3.8 3.4* 3.6  CL 127* 129* 125*  CO2 23 23 22   BUN 80* 76* 70*  CREATININE 2.57* 2.35* 2.46*  GLUCOSE 402* 144* 174*    Electrolytes  Recent Labs Lab 11/15/16 0540 11/16/16 0344 11/17/16 0500  CALCIUM 9.3 9.4 9.0  MG 2.4 2.4 2.3  PHOS 3.8 4.2 3.7    CBC  Recent Labs Lab 11/15/16 0540 11/16/16 0344 11/17/16 0500  WBC 15.7* 16.8* 18.5*  HGB 12.7* 12.8* 12.1*  HCT 41.0 41.4 38.3*  PLT 116* 129* 153    Coag's  Recent Labs Lab 11/17/16 0500  INR 1.24    Sepsis Markers No results for input(s): LATICACIDVEN, PROCALCITON, O2SATVEN in the last 168 hours.  ABG  Recent Labs Lab 11/15/16 0354  PHART 7.486*  PCO2ART 28.1*  PO2ART 126*    Liver Enzymes  Recent Labs Lab 11/11/16 0445 11/12/16 0355  AST 337* 217*  ALT 204* 190*  ALKPHOS 89 113  BILITOT 1.8* 1.0  ALBUMIN 2.1* 2.0*    Cardiac Enzymes No results for input(s): TROPONINI, PROBNP in the last 168  hours.  Glucose  Recent Labs Lab 11/16/16 1154 11/16/16 1520 11/16/16 1948 11/16/16 2324 11/17/16 0347 11/17/16 0750  GLUCAP 284* 324* 405* 213* 167* 204*    Imaging   STUDIES:  EEG 11/22 > severe diffuse dysfxn Ct head 11/22 > neg acute  MRI Head 11/27 > Mild atrophy and white matter disease is slightly advanced for age. This likely reflects the sequela of chronic microvascular ischemia  CULTURES: Sputum 11/22>>> (-) MRSA 11/22 > (-) Blood 11/22 > (-) Blood 11/29 > aerobic bottle + for gpc clusters, staph species, coagulase negative   ANTIBIOTICS: Zosyn 11/22>>> 11/27 Vancomycin > 11/30  SIGNIFICANT EVENTS: 11/22 admit after being found unresponsive. Hypogly. proloned down time.   LINES/TUBES: ETT 11/22>>>  DISCUSSION: 60 yo male with severe hypoglycemia and  persistent AMS and acute respiratory failure with obvious aspiration.    ASSESSMENT / PLAN:  PULMONARY Acute hypoxemic respiratory failure  2/2 unable to protect airway and asp pna. Possible HCAP.  P:   Vent support - 8cc/kg  Mental status is barrier to weaning  Plan to trach on Today  CARDIOVASCULAR Hx HTN  Demand ischemia Tachycardia  P:  Hold home cozaar (r/t renal function)  Increase Metoprolol 25 BID  Trend BP  Cardiac Monitoring   RENAL AKI - mild  Severe Rhabdo. Improving.  Hypernatremia P:   Replace Electrolytes as needed  Free water 400 Q4H D5 water at 5050ml/hr  Trend CK Total   GASTROINTESTINAL Elevated transaminases - down-trending   P:   Trend LFT's  TF pepcid  PEG tube per IR   HEMATOLOGIC No active issue  P:  Trend CBC   SQ heparin   INFECTIOUS Aspiration PNA - will cover nosocomial as he has been visiting his wife frequently in SNF the last month -BC positive for gpc clusters  P:     Trend WBC and Fever Curve  Continue Vanco Follow Cultures   ENDOCRINE IDDM  Severe hypoglycemia -- unclear if suicide attempt given depression s/s, pictures strewn about the home . CBG better.  P:   cont TF   SSI and Lantus and TF coverage Increase Lantus 25 BID    NEUROLOGIC AMS - r/t severe hypoglycemia with unknown down time.  Last seen normal 2 days prior to admit -EEG c/w severe cerebral injury. -Poor prognosis need family goals of care  P:   Neurology following  RASS goal: 0 PRN fentanyl  FAMILY  - Updates:  Daughter and wife updated 11/29. They want to do everything possible, including a trach.   - Inter-disciplinary family meet or Palliative Care meeting due by:  11/29  Critical care time with this patient today : 31 minutes.   [name], AG-ACNP [place] [facility]  Pgr: [phone]  PCCM Pgr: [phone]  Attending Note:  60 year old male with severe hypoglycemic encephalopathy with whom's family I spoke  11/29 and they are insistent on full support. On exam, withdraws right leg to pain with intermittent seizure like activity (not seizure). I reviewed cxr myself, ETT ok. Family still wishes for full support. Tracheostomy today.  Plan to advance quickly to trach collar.  Will need PEG placement as well likely early next week then will proceed with placement after that.  Hopefully only to a trach SNF not vent SNF.  Continue with free water and D5 for hypernatremia, replace electrolytes and will f/u.  The patient is critically ill with multiple organ systems failure and requires high complexity decision making for assessment  and support, frequent evaluation and titration of therapies, application of advanced monitoring technologies and extensive interpretation of multiple databases.   Critical Care Time devoted to patient care services described in this note is 35 Minutes. This time reflects time of care of this signee Dr Koren BoundWesam Saya Mccoll. This critical care time does not reflect procedure time, or teaching time or supervisory time of PA/NP/Med student/Med Resident etc but could involve care discussion time.  Alyson ReedyWesam G. Derius Ghosh, M.D. [facility]. Pager: [phone]. After hours pager: [phone].

## 2016-11-17 NOTE — Consult Note (Signed)
Chief Complaint: Patient was seen in consultation today for percutaneous gastrostomy tube placement.   Referring Physician(s): Dr. Koren Bound  Supervising Physician: Gilmer Mor  Patient Status: [facility] - In-pt  History of Present Illness: Andrew [name] is a 60 y.o. male with history of IDDM, GERD, HTN, OSA presented 11/22 after he was found down/unresponsive at home for unknown period of time.  Intubated and sedated for acute respiratory failure and hypoglycemic encephalopathy.  Patient now determined to have severe cerebral injury with poor prognosis, but family wishes for him to remain full code with full support. Patient underwent trach placement today.  Patient will need gastric tube for ongoing nutrition support as well as long-term care.   IR consulted for percutaneous gastrostomy tube placement. CT Abd completed yesterday. Dr. Loreta Ave reviewed anatomy and feels patient appropriate for procedure.   Patient currently on TF. Receiving SQ heparin.   Past Medical History:  Diagnosis Date  . Blindness   . Diabetes mellitus (HCC)   . GERD (gastroesophageal reflux disease)   . HTN (hypertension)   . Hyperlipidemia   . OSA (obstructive sleep apnea)    AHI 56.3/hr, loud snoring, desaturation to 73% on RA (Dr. Jetty Duhamel)  . Tachycardia    resting; on low dose B-Blocker    Past Surgical History:  Procedure Laterality Date  . CHOLECYSTECTOMY    . TRANSTHORACIC ECHOCARDIOGRAM  08/25/2010   normal; EF=>55%; mild-mod conc LVH; mild MR    Allergies: Lisinopril  Medications: Prior to Admission medications   Medication Sig Start Date End Date Taking? Authorizing Provider  BD PEN NEEDLE NANO U/F 32G X 4 MM MISC USE AS DIRECTED 05/31/15   Romero Belling, MD  esomeprazole (NEXIUM) 40 MG capsule Take 40 mg by mouth 2 (two) times daily before a meal.     Historical Provider, MD  famotidine (PEPCID) 20 MG tablet Take 20 mg by mouth at bedtime.    Historical Provider, MD    fexofenadine (ALLEGRA) 180 MG tablet Take 180 mg by mouth daily.  07/17/13   Historical Provider, MD  furosemide (LASIX) 20 MG tablet Take 20 mg by mouth daily as needed (for swollen legs).     Historical Provider, MD  gabapentin (NEURONTIN) 100 MG capsule Take 100 mg by mouth 3 (three) times daily.    Historical Provider, MD  gabapentin (NEURONTIN) 300 MG capsule Take 300 mg by mouth 3 (three) times daily.    Historical Provider, MD  Insulin Detemir (LEVEMIR FLEXPEN) 100 UNIT/ML Pen Inject 300 Units into the skin every morning.    Historical Provider, MD  Insulin Glargine (LANTUS SOLOSTAR) 100 UNIT/ML Solostar Pen Inject 200 Units into the skin every morning. And pen needles 3/day Patient taking differently: Inject 200 Units into the skin every morning.  08/07/16   Romero Belling, MD  losartan (COZAAR) 100 MG tablet Take 100 mg by mouth daily.  09/11/13   Historical Provider, MD  metoprolol succinate (TOPROL-XL) 25 MG 24 hr tablet Take 25 mg by mouth daily.  07/07/13   Historical Provider, MD  pantoprazole (PROTONIX) 40 MG tablet Take 40 mg by mouth daily.  10/23/13   Historical Provider, MD  PROAIR HFA 108 (90 BASE) MCG/ACT inhaler Reported on 12/03/2015 06/10/13   Historical Provider, MD  SYMBICORT 160-4.5 MCG/ACT inhaler Inhale 2 puffs into the lungs 2 (two) times daily.  03/30/15   Historical Provider, MD  triamcinolone cream (KENALOG) 0.5 % Apply 1 application topically 2 (two) times daily as needed.  Historical Provider, MD  zolpidem (AMBIEN) 10 MG tablet Take 10 mg by mouth at bedtime as needed for sleep.     Historical Provider, MD     Family History  Problem Relation Age of Onset  . Cancer Paternal Grandmother   . Diabetes Father   . Diabetes Brother   . Diabetes Maternal Grandmother     Social History   Social History  . Marital status: Married    Spouse name: N/A  . Number of children: 2  . Years of education: N/A   Occupational History  . disabled    Social History Main  Topics  . Smoking status: Never Smoker  . Smokeless tobacco: Never Used  . Alcohol use Yes     Comment: couples times a year  . Drug use: No  . Sexual activity: Not on file   Other Topics Concern  . Not on file   Social History Narrative  . No narrative on file    Review of Systems:  Patient intubated and sedated  Vital Signs: BP 133/82   Pulse (!) 111   Temp 99 F (37.2 C)   Resp 18   Ht 6' (1.829 m)   Wt 223 lb 5.2 oz (101.3 kg)   SpO2 95%   BMI 30.29 kg/m   Physical Exam  Constitutional: He is oriented to person, place, and time. He appears well-developed.  Cardiovascular: Normal rate, regular rhythm and normal heart sounds.   Pulmonary/Chest: Effort normal. No respiratory distress.  Trach in place  Abdominal: Soft. He exhibits no distension. There is no guarding.  No scars or evidence of prior abdominal procedures  Neurological: He is alert and oriented to person, place, and time.  Skin: Skin is warm and dry.  Nursing note and vitals reviewed.   Mallampati Score:  MD Evaluation Airway: Other (comments) Airway comments: trach Heart: WNL Abdomen: WNL Chest/ Lungs: WNL ASA  Classification: 3 Mallampati/Airway Score: Two  Imaging: Ct Abdomen Wo Contrast  Result Date: 11/17/2016 CLINICAL DATA:  Dysphagia. EXAM: CT ABDOMEN WITHOUT CONTRAST TECHNIQUE: Multidetector CT imaging of the abdomen was performed following the standard protocol without IV contrast. COMPARISON:  None. FINDINGS: Lower chest: Mild bilateral posterior basilar atelectasis or pneumonia is noted. Hepatobiliary: No focal liver abnormality is seen. Status post cholecystectomy. No biliary dilatation. Pancreas: Unremarkable. No pancreatic ductal dilatation or surrounding inflammatory changes. Spleen: Normal in size without focal abnormality. Adrenals/Urinary Tract: Adrenal glands are unremarkable. Kidneys are normal, without renal calculi, focal lesion, or hydronephrosis. Stomach/Bowel: Nasogastric  tube tip is seen in stomach. There is no evidence of bowel obstruction. The appendix appears normal. Vascular/Lymphatic: No significant vascular findings are present. No enlarged abdominal or pelvic lymph nodes. Other: Moderate size fat containing periumbilical hernia is noted. Musculoskeletal: No acute or significant osseous findings. IMPRESSION: Mild bilateral posterior basilar atelectasis or pneumonia is noted. Nasogastric tube tip is seen in stomach. Moderate size fat containing periumbilical hernia is noted. Status post cholecystectomy. No acute abnormality is noted in the abdomen. Electronically Signed   By: Lupita Raider, M.D.   On: 11/17/2016 07:43   Ct Head Wo Contrast  Result Date: 11/08/2016 CLINICAL DATA:  Altered mental status.  Found unresponsive. EXAM: CT HEAD WITHOUT CONTRAST TECHNIQUE: Contiguous axial images were obtained from the base of the skull through the vertex without intravenous contrast. COMPARISON:  None. FINDINGS: Brain: No acute intracranial abnormality. Specifically, no hemorrhage, hydrocephalus, mass lesion, acute infarction, or significant intracranial injury. Vascular: No hyperdense vessel or unexpected  calcification. Skull: No acute calvarial abnormality. Sinuses/Orbits: Mucosal thickening throughout the paranasal sinuses. Mastoid air cells are clear. Small calcified globes bilaterally. Other: None IMPRESSION: No acute intracranial abnormality. Chronic sinusitis. Electronically Signed   By: Charlett Nose M.D.   On: 11/08/2016 11:18   Mr Brain Wo Contrast  Result Date: 11/13/2016 CLINICAL DATA:  Hypoglycemia. Altered mental status. Found unresponsive. Abnormal EEG. EXAM: MRI HEAD WITHOUT CONTRAST TECHNIQUE: Multiplanar, multiecho pulse sequences of the brain and surrounding structures were obtained without intravenous contrast. COMPARISON:  CT head without contrast 11/08/2016. FINDINGS: Brain: No acute infarct, hemorrhage, or mass lesion is present. Mild atrophy and white  matter changes are present. A cavum septum pellucidum is incidentally noted. No other significant white matter disease is present. The internal auditory canals are within normal limits bilaterally. The brainstem and cerebellum are normal. Flow is present in the major intracranial arteries. Vascular: Flow is present in the major intracranial arteries. Skull and upper cervical spine: The skullbase is within normal limits. Marrow signal is normal. The craniocervical junction is within normal limits. Midline sagittal structures are unremarkable. Sinuses/Orbits: Fluid levels are present maxillary sinuses bilaterally. There is fluid in the oral pharynx. Fluid levels are present in the sphenoid sinuses. There is near total opacification of the ethmoid air cells bilaterally. Fluid levels are present in the inferior frontal sinuses as well. Bilateral mastoid effusions are noted. The globes are shrunken with calcifications bilaterally compatible with phthisis bulbi. No orbital mass is present. IMPRESSION: 1. Mild atrophy and white matter disease is slightly advanced for age. This likely reflects the sequela of chronic microvascular ischemia. 2. No acute or focal intracranial abnormality otherwise. 3. Bilateral phthisis bulbi. 4. Extensive sinus disease likely related to intubation. Electronically Signed   By: Marin Roberts M.D.   On: 11/13/2016 14:02   Dg Chest Port 1 View  Result Date: 11/17/2016 CLINICAL DATA:  Tracheostomy tube placement. EXAM: PORTABLE CHEST 1 VIEW COMPARISON:  Chest x-ray 11/17/2016 FINDINGS: The tracheostomy tube is well position. The tip is in the mid trachea. The cardiac silhouette, mediastinal and hilar contours are stable. The lungs are grossly clear. IMPRESSION: Tracheostomy tube in good position without complicating features. Electronically Signed   By: Rudie Meyer M.D.   On: 11/17/2016 12:33   Dg Chest Port 1 View  Result Date: 11/17/2016 CLINICAL DATA:  Ventilator dependent  respiratory insufficiency. EXAM: PORTABLE CHEST 1 VIEW COMPARISON:  11/15/2016. FINDINGS: Normal cardiomediastinal silhouette. Support tubes and lines appear stable. Low lung volumes. Bibasilar subsegmental atelectatic change without consolidation or edema. No effusion or pneumothorax. IMPRESSION: Stable chest. Electronically Signed   By: Elsie Stain M.D.   On: 11/17/2016 08:04   Dg Chest Port 1 View  Result Date: 11/15/2016 CLINICAL DATA:  Shortness of breath. EXAM: PORTABLE CHEST 1 VIEW COMPARISON:  Radiograph November 14, 2016. FINDINGS: The heart size and mediastinal contours are within normal limits. Hypoinflation of the lungs is noted. Stable bibasilar opacities are noted most consistent with atelectasis or infiltrates. No pneumothorax or pleural effusion is noted. The visualized skeletal structures are unremarkable. IMPRESSION: Hypoinflation of the lungs. Stable bibasilar opacities as described above. Electronically Signed   By: Lupita Raider, M.D.   On: 11/15/2016 07:14   Dg Chest Port 1 View  Result Date: 11/14/2016 CLINICAL DATA:  Acute respiratory failure EXAM: PORTABLE CHEST 1 VIEW COMPARISON:  11/13/2016 FINDINGS: Low lung volumes with bibasilar atelectasis or infiltrates, left slightly greater than right. Heart is borderline in size. No visible effusions. Support  devices are stable. IMPRESSION: Low lung volumes with continued bibasilar atelectasis or infiltrates. No real change. Electronically Signed   By: Charlett Nose M.D.   On: 11/14/2016 07:16   Dg Chest Port 1 View  Result Date: 11/13/2016 CLINICAL DATA:  ETT placement.  Coughing. EXAM: PORTABLE CHEST 1 VIEW COMPARISON:  11/11/2016 FINDINGS: Patient is rotated to the left. Right-sided PICC line unchanged. Nasogastric tube courses into the stomach and off the inferior portion of the film. Endotracheal tube has tip approximately 3.7 cm above the carina. Lungs are adequately inflated with minimal left basilar opacification slightly  improved likely atelectasis although cannot exclude infection. No evidence of effusion or pneumothorax. Cardiomediastinal silhouette and remainder of the exam is unchanged. IMPRESSION: Minimal left basilar opacification slightly improved which may be due to atelectasis or infection. Tubes and lines as described. Electronically Signed   By: Elberta Fortis M.D.   On: 11/13/2016 17:57   Dg Chest Port 1 View  Result Date: 11/11/2016 CLINICAL DATA:  Acute respiratory failure EXAM: PORTABLE CHEST 1 VIEW COMPARISON:  11/10/2016 FINDINGS: Cardiac shadow remains enlarged. Endotracheal tube and nasogastric catheter are again seen. The overall inspiratory effort is poor with bibasilar atelectatic changes right-sided PICC line is again noted and stable. No bony abnormality is seen. IMPRESSION: Stable bibasilar changes.  No new abnormality is seen. Electronically Signed   By: Alcide Clever M.D.   On: 11/11/2016 07:16   Dg Chest Port 1 View  Result Date: 11/10/2016 CLINICAL DATA:  Respiratory failure. EXAM: PORTABLE CHEST 1 VIEW COMPARISON:  11/09/2016. FINDINGS: Endotracheal tube, NG tube in stable position. Right PICC line noted with tip projected over the right atrium. Heart size stable. Bilateral bilateral pulmonary infiltrates again noted. Right base pulmonary mass lesion again cannot be excluded and continued follow-up exam suggested to demonstrate clearing . No pleural effusion or pneumothorax. IMPRESSION: 1. Right PICC line noted with tip projected over the right atrium. Endotracheal tube and NG tube in stable position. 2. Bibasilar pulmonary infiltrates again noted. Right base pulmonary mass lesion again cannot be excluded continued follow-up chest x-rays to demonstrate clearing suggested . Electronically Signed   By: Maisie Fus  Register   On: 11/10/2016 07:08   Dg Chest Port 1 View  Result Date: 11/09/2016 CLINICAL DATA:  Shortness of breath.  Endotracheal tube. EXAM: PORTABLE CHEST 1 VIEW COMPARISON:   11/08/2016 FINDINGS: Patient is rotated to the right. Endotracheal tube with tip 2.9 cm above the carina. Nasogastric tube courses through the stomach with tip over the right upper quadrant likely over the distal stomach or proximal duodenum. Lungs are somewhat hypoinflated with persistent focal opacification in right base and mild opacification over the left perihilar region/retrocardiac region. No definite effusion or pneumothorax. Cardiomediastinal silhouette and remainder of the exam is unchanged. IMPRESSION: Stable opacification over the right base and left perihilar region and medial left base. Findings may be due to atelectasis or infection. Cannot exclude a mass in the right base as recommend follow-up chest radiograph to resolution. Tubes and lines unchanged. Electronically Signed   By: Elberta Fortis M.D.   On: 11/09/2016 07:51   Dg Chest Port 1 View  Result Date: 11/08/2016 CLINICAL DATA:  Shortness of breath.  Post intubation. EXAM: PORTABLE CHEST 1 VIEW COMPARISON:  06/09/2016 FINDINGS: Grossly unchanged cardiac silhouette and mediastinal contours. Endotracheal tube overlies the tracheal air column with tip approximately 3.2 cm above the carina. Enteric tube tip and side port project below left hemidiaphragm. Epicardial pacer pads overlie the cardiac apex.  Ill-defined heterogeneous potential airspace opacities within the bilateral lower lungs. No pleural effusion or pneumothorax. No definite evidence of edema. No acute osseus abnormalities. Post cholecystectomy. IMPRESSION: 1. Appropriately positioned support apparatus as above. No pneumothorax. 2. Bibasilar heterogeneous airspace opacities worrisome for infection and/or aspiration. Continued attention on follow-up is recommended. Electronically Signed   By: Simonne ComeJohn  Watts M.D.   On: 11/08/2016 10:46   Dg Abd Portable 1v  Result Date: 11/08/2016 CLINICAL DATA:  Post enteric tube placement. EXAM: PORTABLE ABDOMEN - 1 VIEW COMPARISON:  Chest  radiograph - earlier same day FINDINGS: Enteric tube tip and side port projected the expected location of the gastric antrum. Paucity of bowel gas without definite evidence of enteric obstruction. No supine evidence of pneumoperitoneum. No definite pneumatosis or portal venous gas. Limited visualization of the lower thorax demonstrates bibasilar opacities, right greater than left. No definite acute osseus abnormalities.  Post cholecystectomy. IMPRESSION: 1. Enteric tube tip and side port project over the gastric antrum. 2. Paucity of bowel gas without evidence of enteric obstruction. Electronically Signed   By: Simonne ComeJohn  Watts M.D.   On: 11/08/2016 10:47    Labs:  CBC:  Recent Labs  11/14/16 0405 11/15/16 0540 11/16/16 0344 11/17/16 0500  WBC 14.0* 15.7* 16.8* 18.5*  HGB 12.7* 12.7* 12.8* 12.1*  HCT 39.9 41.0 41.4 38.3*  PLT 114* 116* 129* 153    COAGS:  Recent Labs  11/17/16 0500  INR 1.24    BMP:  Recent Labs  11/14/16 0405 11/15/16 0540 11/16/16 0344 11/17/16 0500  NA 159* 157* 160* 157*  K 3.7 3.8 3.4* 3.6  CL 126* 127* 129* 125*  CO2 24 23 23 22   GLUCOSE 304* 402* 144* 174*  BUN 81* 80* 76* 70*  CALCIUM 9.4 9.3 9.4 9.0  CREATININE 2.60* 2.57* 2.35* 2.46*  GFRNONAA 25* 26* 29* 27*  GFRAA 29* 30* 33* 31*    LIVER FUNCTION TESTS:  Recent Labs  11/08/16 1021 11/10/16 0420 11/11/16 0445 11/12/16 0355  BILITOT 1.1 1.1 1.8* 1.0  AST 218* 463* 337* 217*  ALT 92* 193* 204* 190*  ALKPHOS 80 68 89 113  PROT 8.1 5.8* 6.1* 6.3*  ALBUMIN 3.4* 2.1* 2.1* 2.0*    TUMOR MARKERS: No results for input(s): AFPTM, CEA, CA199, CHROMGRNA in the last 8760 hours.  Assessment and Plan:  Percutaneous gastrostomy tube placement  Patient with history of IDDM, HTN, GERD, and OSA found down at home and admitted with AMS, respiratory failure requiring intubation.  Patient with severe cerebral injury due to hypoglycemia at time of admission now requiring long-term care including  trach and nutrition support.  IR consulted for percutaneous gastrostomy tube placement.  Risks and benefits discussed with the patient including, but not limited to the need for a barium enema during the procedure, bleeding, infection, peritonitis, or damage to adjacent structures. All of the patient's questions were answered, patient is agreeable to proceed. Consent signed and in chart.  Patient with current elevated WBC.  Will schedule for early next week in IR.  Wife aware of schedule/plan.   Thank you for this interesting consult.  I greatly enjoyed meeting Sherran NeedsJohnny R Rappa and look forward to participating in their care.  A copy of this report was sent to the requesting provider on this date.  Electronically Signed: Hoyt KochKacie Sue-Ellen Matthews 11/17/2016, 3:41 PM   I spent a total of 20 Minutes  in face to face in clinical consultation, greater than 50% of which was counseling/coordinating care for percutaneous  gastrostomy tube placement.

## 2016-11-17 NOTE — Procedures (Signed)
Percutaneous Tracheostomy Placement  Consent from family.  Patient sedated, paralyzed and position.  Placed on 100% FiO2 and RR matched.  Area cleaned and draped.  Lidocaine/epi injected.  Skin incision done followed by blunt dissection.  Trachea palpated then punctured, catheter passed and visualized bronchoscopically.  Wire placed and visualized.  Catheter removed.  Airway then crushed and dilated.  Size 6 cuffed shiley trach placed and visualized bronchoscopically well above carina.  Good volume returns.  Patient tolerated the procedure well without complications.  Minimal blood loss.  CXR ordered and pending.  Wesam G. Yacoub, M.D. [facility]. Pager: [phone]. After hours pager: [phone]. 

## 2016-11-17 NOTE — Progress Notes (Signed)
Order for swallow evaluation received and then cancelled.  Please reorder if indicated.  Note pt received size 6 cuffed tracheostomy today.   Donavan Burnetamara Andrew King, MS [facility] SLP [phone]

## 2016-11-17 NOTE — Progress Notes (Signed)
Inpatient Diabetes Program Recommendations  AACE/ADA: New Consensus Statement on Inpatient Glycemic Control (2015)  Target Ranges:  Prepandial:   less than 140 mg/dL      Peak postprandial:   less than 180 mg/dL (1-2 hours)      Critically ill patients:  140 - 180 mg/dL   Lab Results  Component Value Date   GLUCAP 204 (H) 11/17/2016   HGBA1C 7.9 10/09/2016    Review of Glycemic Control:  Results for Andrew [name], Kalani R (MRN [id number]) as of 11/17/2016 09:29  Ref. Range 11/16/2016 08:11 11/16/2016 11:54 11/16/2016 15:20 11/16/2016 19:48 11/16/2016 23:24 11/17/2016 03:47 11/17/2016 07:50  Glucose-Capillary Latest Ref Range: 65 - 99 mg/dL 147258 (H) 829284 (H) 562324 (H) 405 (H) 213 (H) 167 (H) 204 (H)   Diabetes history: Type 2 diabetes Outpatient Diabetes medications: Lantus 200 units daily Current orders for Inpatient glycemic control:  Novolog sensitive q 4 hours, Lantus 30 units daily, Novolog 4 units q 4 hours  Inpatient Diabetes Program Recommendations:   May consider increasing Lantus to 20 units bid.   Thanks, Beryl MeagerJenny Treavon Castilleja, RN, BC-ADM Inpatient Diabetes Coordinator Pager [phone] (8a-5p)

## 2016-11-18 ENCOUNTER — Inpatient Hospital Stay (HOSPITAL_COMMUNITY): Payer: Medicare Other

## 2016-11-18 LAB — BASIC METABOLIC PANEL
Anion gap: 8 (ref 5–15)
BUN: 64 mg/dL — ABNORMAL HIGH (ref 6–20)
CALCIUM: 9 mg/dL (ref 8.9–10.3)
CO2: 22 mmol/L (ref 22–32)
CREATININE: 2.52 mg/dL — AB (ref 0.61–1.24)
Chloride: 130 mmol/L — ABNORMAL HIGH (ref 101–111)
GFR calc Af Amer: 30 mL/min — ABNORMAL LOW (ref >60)
GFR calc non Af Amer: 26 mL/min — ABNORMAL LOW (ref >60)
GLUCOSE: 165 mg/dL — AB (ref 65–99)
Potassium: 3.7 mmol/L (ref 3.5–5.1)
Sodium: 160 mmol/L — ABNORMAL HIGH (ref 135–145)

## 2016-11-18 LAB — BLOOD GAS, ARTERIAL
Acid-base deficit: 1.5 mmol/L (ref 0.0–2.0)
Bicarbonate: 21 mmol/L (ref 20.0–28.0)
DRAWN BY: 24487
FIO2: 30
MECHVT: 620 mL
O2 Saturation: 97.2 %
PEEP/CPAP: 5 cmH2O
PO2 ART: 92.4 mmHg (ref 83.0–108.0)
Patient temperature: 98
RATE: 18 resp/min
pCO2 arterial: 25.1 mmHg — ABNORMAL LOW (ref 32.0–48.0)
pH, Arterial: 7.53 — ABNORMAL HIGH (ref 7.350–7.450)

## 2016-11-18 LAB — GLUCOSE, CAPILLARY
GLUCOSE-CAPILLARY: 157 mg/dL — AB (ref 65–99)
GLUCOSE-CAPILLARY: 151 mg/dL — AB (ref 65–99)
Glucose-Capillary: 137 mg/dL — ABNORMAL HIGH (ref 65–99)
Glucose-Capillary: 167 mg/dL — ABNORMAL HIGH (ref 65–99)
Glucose-Capillary: 184 mg/dL — ABNORMAL HIGH (ref 65–99)

## 2016-11-18 LAB — CBC
HCT: 40.1 % (ref 39.0–52.0)
Hemoglobin: 12.5 g/dL — ABNORMAL LOW (ref 13.0–17.0)
MCH: 29 pg (ref 26.0–34.0)
MCHC: 31.2 g/dL (ref 30.0–36.0)
MCV: 93 fL (ref 78.0–100.0)
PLATELETS: 156 10*3/uL (ref 150–400)
RBC: 4.31 MIL/uL (ref 4.22–5.81)
RDW: 16.2 % — AB (ref 11.5–15.5)
WBC: 17.7 10*3/uL — ABNORMAL HIGH (ref 4.0–10.5)

## 2016-11-18 LAB — MAGNESIUM: MAGNESIUM: 2.6 mg/dL — AB (ref 1.7–2.4)

## 2016-11-18 LAB — PHOSPHORUS: Phosphorus: 4 mg/dL (ref 2.5–4.6)

## 2016-11-18 NOTE — Progress Notes (Signed)
PULMONARY / CRITICAL CARE MEDICINE   Name: Andrew [name] MRN: [id number] DOB: 08/20/1956    ADMISSION DATE:  11/08/2016  REFERRING MD:  EDP   CHIEF COMPLAINT:  AMS, respiratory failure, aspiration   Brief:   60 yo male with hx IDDM, GERD, HTN, OSA presented 11/22 after being found unresponsive at home for unknown period of time.  He lives with his wife but she has been in SNF for the last month.  Was last seen normal 2 days prior although family is trying to determine if his home health aide saw him in the interim.  Was found to have glucose 21, no improvement in mental status with D50.  He was bagged by EMS en route and had multiple episodes of vomiting with obvious aspiration.  Intubated on arrival to ER and PCCM called to admit.  Of note, daughter concerned that he has been very depressed the last few weeks.  There were family pictures strewn about the house when family arrived.  He has had little to no PO intake but continues to take his insulin per family.      SUBJECTIVE:  No events overnight. No improvement in neuro status  VITAL SIGNS: BP 121/73   Pulse (!) 125   Temp (!) 100.6 F (38.1 C) (Oral)   Resp (!) 29   Ht 6' (1.829 m)   Wt 101.3 kg (223 lb 5.2 oz)   SpO2 98%   BMI 30.29 kg/m   HEMODYNAMICS:    VENTILATOR SETTINGS: Vent Mode: PSV;CPAP FiO2 (%):  [30 %] 30 % Set Rate:  [18 bmp] 18 bmp Vt Set:  [620 mL] 620 mL PEEP:  [5 cmH20] 5 cmH20 Pressure Support:  [15 cmH20] 15 cmH20 Plateau Pressure:  [19 cmH20-22 cmH20] 21 cmH20  INTAKE / OUTPUT: I/O last 3 completed shifts: In: 2310 [I.V.:510; Other:250; NG/GT:1200; IV Piggyback:350] Out: 2620 [Urine:2620]  PHYSICAL EXAMINATION: General:  Chronically ill appearing male, on vent  Neuro: pos gag, with painful stimulation withdraws right side  HEENT:  Pupils are unable to be assessed as cataracts obstruct view, ETT  Cardiovascular:  S1s2, tachy, generalized edema  Lungs:  Unlabored, diminished breath sounds,  on vent  Abdomen:  Round, soft, hypoactive bs  Musculoskeletal:  Warm and dry, venous stasis   LABS:  BMET  Recent Labs Lab 11/16/16 0344 11/17/16 0500 11/18/16 0500  NA 160* 157* 160*  K 3.4* 3.6 3.7  CL 129* 125* 130*  CO2 23 22 22   BUN 76* 70* 64*  CREATININE 2.35* 2.46* 2.52*  GLUCOSE 144* 174* 165*    Electrolytes  Recent Labs Lab 11/16/16 0344 11/17/16 0500 11/18/16 0500  CALCIUM 9.4 9.0 9.0  MG 2.4 2.3 2.6*  PHOS 4.2 3.7 4.0    CBC  Recent Labs Lab 11/16/16 0344 11/17/16 0500 11/18/16 0500  WBC 16.8* 18.5* 17.7*  HGB 12.8* 12.1* 12.5*  HCT 41.4 38.3* 40.1  PLT 129* 153 156    Coag's  Recent Labs Lab 11/17/16 0500  INR 1.24    Sepsis Markers No results for input(s): LATICACIDVEN, PROCALCITON, O2SATVEN in the last 168 hours.  ABG  Recent Labs Lab 11/15/16 0354 11/18/16 0425  PHART 7.486* 7.530*  PCO2ART 28.1* 25.1*  PO2ART 126* 92.4    Liver Enzymes  Recent Labs Lab 11/12/16 0355  AST 217*  ALT 190*  ALKPHOS 113  BILITOT 1.0  ALBUMIN 2.0*    Cardiac Enzymes No results for input(s): TROPONINI, PROBNP in the last 168 hours.  Glucose  Recent Labs Lab 11/17/16 1121 11/17/16 1549 11/17/16 1947 11/17/16 2334 11/18/16 0332 11/18/16 0837  GLUCAP 269* 246* 221* 208* 137* 167*    Imaging   STUDIES:  EEG 11/22 > severe diffuse dysfxn Ct head 11/22 > neg acute  MRI Head 11/27 > Mild atrophy and white matter disease is slightly advanced for age. This likely reflects the sequela of chronic microvascular ischemia  CULTURES: Sputum 11/22>>> (-) MRSA 11/22 > (-) Blood 11/22 > (-) Blood 11/29 > aerobic bottle + for gpc clusters   ANTIBIOTICS: Zosyn 11/22>>> 11/27 Vancomycin > 11/30  SIGNIFICANT EVENTS: 11/22 admit after being found unresponsive. Hypogly. proloned down time.   LINES/TUBES: ETT 11/22>>>12/1 Trach 12/1>>> R PICC 11/23>>>  DISCUSSION: 60 yo male with severe hypoglycemia and persistent AMS and  acute respiratory failure with obvious aspiration.    ASSESSMENT / PLAN:  PULMONARY Acute hypoxemic respiratory failure  2/2 unable to protect airway and asp pna. Possible HCAP.  P:   Vent support - 8cc/kg  Mental status is barrier to weaning  PS trials as able but no TC given high PS need  CARDIOVASCULAR Hx HTN  Demand ischemia Tachycardia  P:  Hold home cozaar Continue Metoprolol 12.5 BID  Trend BP  Cardiac Monitoring   RENAL AKI - mild  Severe Rhabdo. Improving.  Hypernatremia P:   Replace Electrolytes as needed  Free water 400 Q4H to be restarted after placement KVO IVF  Trend CK Total   GASTROINTESTINAL Elevated transaminases - down-trending   P:   Trend LFT's  TF Pepcid  PEG tube per IR   HEMATOLOGIC No active issue  P:  Trend CBC   SQ heparin   INFECTIOUS Aspiration PNA - will cover nosocomial as he has been visiting his wife frequently in SNF the last month -BC positive for gpc clusters  P:     Trend WBC and Fever Curve  Added Vanco, stop date in place Repeat BC noted, NTD  ENDOCRINE IDDM  Severe hypoglycemia -- unclear if suicide attempt given depression s/s, pictures strewn about the home . CBG better.  P:   Cont TF after placement of cortrak SSI and Lantus and TF coverage   NEUROLOGIC AMS - r/t severe hypoglycemia with unknown down time.  Last seen normal 2 days prior to admit -EEG c/w severe cerebral injury. -Poor prognosis need family goals of care  P:   Neurology following, appreciate input  RASS goal: 0 D/C fentanyl  FAMILY  - Updates:  No family bedside 12/2.  - Inter-disciplinary family meet or Palliative Care meeting due by:  11/29  The patient is critically ill with multiple organ systems failure and requires high complexity decision making for assessment and support, frequent evaluation and titration of therapies, application of advanced monitoring technologies and extensive interpretation of multiple databases.    Critical Care Time devoted to patient care services described in this note is  35  Minutes. This time reflects time of care of this signee Dr Koren BoundWesam Yacoub. This critical care time does not reflect procedure time, or teaching time or supervisory time of PA/NP/Med student/Med Resident etc but could involve care discussion time.  Alyson ReedyWesam G. Yacoub, M.D. [facility]/Critical Care Medicine. Pager: [phone]. After hours pager: [phone].

## 2016-11-19 DIAGNOSIS — E870 Hyperosmolality and hypernatremia: Secondary | ICD-10-CM

## 2016-11-19 LAB — BASIC METABOLIC PANEL
ANION GAP: 8 (ref 5–15)
BUN: 65 mg/dL — ABNORMAL HIGH (ref 6–20)
CHLORIDE: 129 mmol/L — AB (ref 101–111)
CO2: 22 mmol/L (ref 22–32)
Calcium: 8.6 mg/dL — ABNORMAL LOW (ref 8.9–10.3)
Creatinine, Ser: 2.56 mg/dL — ABNORMAL HIGH (ref 0.61–1.24)
GFR calc Af Amer: 30 mL/min — ABNORMAL LOW (ref >60)
GFR calc non Af Amer: 26 mL/min — ABNORMAL LOW (ref >60)
GLUCOSE: 251 mg/dL — AB (ref 65–99)
POTASSIUM: 3.7 mmol/L (ref 3.5–5.1)
Sodium: 159 mmol/L — ABNORMAL HIGH (ref 135–145)

## 2016-11-19 LAB — GLUCOSE, CAPILLARY
GLUCOSE-CAPILLARY: 206 mg/dL — AB (ref 65–99)
GLUCOSE-CAPILLARY: 284 mg/dL — AB (ref 65–99)
GLUCOSE-CAPILLARY: 305 mg/dL — AB (ref 65–99)
Glucose-Capillary: 213 mg/dL — ABNORMAL HIGH (ref 65–99)
Glucose-Capillary: 251 mg/dL — ABNORMAL HIGH (ref 65–99)
Glucose-Capillary: 300 mg/dL — ABNORMAL HIGH (ref 65–99)
Glucose-Capillary: 308 mg/dL — ABNORMAL HIGH (ref 65–99)

## 2016-11-19 LAB — PHOSPHORUS: PHOSPHORUS: 3.9 mg/dL (ref 2.5–4.6)

## 2016-11-19 LAB — CBC
HEMATOCRIT: 37.8 % — AB (ref 39.0–52.0)
HEMOGLOBIN: 11.5 g/dL — AB (ref 13.0–17.0)
MCH: 28.8 pg (ref 26.0–34.0)
MCHC: 30.4 g/dL (ref 30.0–36.0)
MCV: 94.5 fL (ref 78.0–100.0)
Platelets: 134 10*3/uL — ABNORMAL LOW (ref 150–400)
RBC: 4 MIL/uL — ABNORMAL LOW (ref 4.22–5.81)
RDW: 16.7 % — ABNORMAL HIGH (ref 11.5–15.5)
WBC: 17.7 10*3/uL — ABNORMAL HIGH (ref 4.0–10.5)

## 2016-11-19 LAB — OSMOLALITY, URINE: OSMOLALITY UR: 598 mosm/kg (ref 300–900)

## 2016-11-19 LAB — MAGNESIUM: Magnesium: 2.7 mg/dL — ABNORMAL HIGH (ref 1.7–2.4)

## 2016-11-19 MED ORDER — POTASSIUM CHLORIDE 20 MEQ/15ML (10%) PO SOLN
40.0000 meq | Freq: Three times a day (TID) | ORAL | Status: AC
  Administered 2016-11-19 (×2): 40 meq
  Filled 2016-11-19 (×2): qty 30

## 2016-11-19 MED ORDER — INSULIN ASPART 100 UNIT/ML ~~LOC~~ SOLN
0.0000 [IU] | SUBCUTANEOUS | Status: DC
  Administered 2016-11-19 (×2): 15 [IU] via SUBCUTANEOUS
  Administered 2016-11-20: 7 [IU] via SUBCUTANEOUS
  Administered 2016-11-20 (×2): 11 [IU] via SUBCUTANEOUS
  Administered 2016-11-20: 4 [IU] via SUBCUTANEOUS

## 2016-11-19 NOTE — Progress Notes (Signed)
Vent alarming high RR, high Min vol.  RR= >43, increased WOB noted.  Placed pt back on full vent support.  Pt immediately calmed down, rr now 20-24.  Pt appears comfortable.

## 2016-11-19 NOTE — Progress Notes (Signed)
PULMONARY / CRITICAL CARE MEDICINE   Name: Andrew [name] MRN: [id number] DOB: 12/26/1955    ADMISSION DATE:  11/08/2016  REFERRING MD:  EDP   CHIEF COMPLAINT:  AMS, respiratory failure, aspiration   Brief:   60 yo male with hx IDDM, GERD, HTN, OSA presented 11/22 after being found unresponsive at home for unknown period of time.  He lives with his wife but she has been in SNF for the last month.  Was last seen normal 2 days prior although family is trying to determine if his home health aide saw him in the interim.  Was found to have glucose 21, no improvement in mental status with D50.  He was bagged by EMS en route and had multiple episodes of vomiting with obvious aspiration.  Intubated on arrival to ER and PCCM called to admit.  Of note, daughter concerned that he has been very depressed the last few weeks.  There were family pictures strewn about the house when family arrived.  He has had little to no PO intake but continues to take his insulin per family.      SUBJECTIVE:  No events overnight. No improvement in neuro status.  Failed weaning this AM  VITAL SIGNS: BP 123/77   Pulse (!) 109   Temp 99.6 F (37.6 C) (Oral)   Resp 18   Ht 6' (1.829 m)   Wt 100.7 kg (222 lb 0.1 oz)   SpO2 97%   BMI 30.11 kg/m   HEMODYNAMICS:    VENTILATOR SETTINGS: Vent Mode: PRVC FiO2 (%):  [30 %] 30 % Set Rate:  [18 bmp] 18 bmp Vt Set:  [620 mL] 620 mL PEEP:  [5 cmH20] 5 cmH20 Pressure Support:  [12 cmH20] 12 cmH20 Plateau Pressure:  [18 cmH20-24 cmH20] 18 cmH20  INTAKE / OUTPUT: I/O last 3 completed shifts: In: 1150 [I.V.:250; NG/GT:600; IV Piggyback:300] Out: 3895 [Urine:3595; Stool:300]  PHYSICAL EXAMINATION: General:  Chronically ill appearing male, on vent  Neuro: Pos gag, with painful stimulation withdraws right side  HEENT:  Pupils are unable to be assessed as cataracts obstruct view, ETT  Cardiovascular:  S1s2, tachy, generalized edema  Lungs:  Unlabored, diminished  breath sounds, on vent  Abdomen:  Round, soft, hypoactive bs  Musculoskeletal:  Warm and dry, venous stasis   LABS:  BMET  Recent Labs Lab 11/17/16 0500 11/18/16 0500 11/19/16 0445  NA 157* 160* 159*  K 3.6 3.7 3.7  CL 125* 130* 129*  CO2 22 22 22   BUN 70* 64* 65*  CREATININE 2.46* 2.52* 2.56*  GLUCOSE 174* 165* 251*   Electrolytes  Recent Labs Lab 11/17/16 0500 11/18/16 0500 11/19/16 0445  CALCIUM 9.0 9.0 8.6*  MG 2.3 2.6* 2.7*  PHOS 3.7 4.0 3.9   CBC  Recent Labs Lab 11/17/16 0500 11/18/16 0500 11/19/16 0445  WBC 18.5* 17.7* 17.7*  HGB 12.1* 12.5* 11.5*  HCT 38.3* 40.1 37.8*  PLT 153 156 134*   Coag's  Recent Labs Lab 11/17/16 0500  INR 1.24   Sepsis Markers No results for input(s): LATICACIDVEN, PROCALCITON, O2SATVEN in the last 168 hours.  ABG  Recent Labs Lab 11/15/16 0354 11/18/16 0425  PHART 7.486* 7.530*  PCO2ART 28.1* 25.1*  PO2ART 126* 92.4   Liver Enzymes No results for input(s): AST, ALT, ALKPHOS, BILITOT, ALBUMIN in the last 168 hours.  Cardiac Enzymes No results for input(s): TROPONINI, PROBNP in the last 168 hours.  Glucose  Recent Labs Lab 11/18/16 1251 11/18/16 1612 11/18/16 1954 11/18/16  2359 11/19/16 0405 11/19/16 0811  GLUCAP 184* 157* 151* 251* 213* 206*    Imaging   STUDIES:  EEG 11/22 > severe diffuse dysfxn Ct head 11/22 > neg acute  MRI Head 11/27 > Mild atrophy and white matter disease is slightly advanced for age. This likely reflects the sequela of chronic microvascular ischemia  CULTURES: Sputum 11/22>>> (-) MRSA 11/22 > (-) Blood 11/22 > (-) Blood 11/29 > aerobic bottle + for gpc clusters   ANTIBIOTICS: Zosyn 11/22>>> 11/27 Vancomycin > 11/30>>>12/3  SIGNIFICANT EVENTS: 11/22 admit after being found unresponsive. Hypogly. proloned down time.   LINES/TUBES: ETT 11/22>>>12/1 Trach 12/1>>> R PICC 11/23>>>  DISCUSSION: 60 yo male with severe hypoglycemia and persistent AMS and acute  respiratory failure with obvious aspiration.    ASSESSMENT / PLAN:  PULMONARY Acute hypoxemic respiratory failure  2/2 unable to protect airway and asp pna. Possible HCAP.  P:   Vent support - 8cc/kg  Mental status is barrier to weaning  PS trials as able but no TC given high PS need  CARDIOVASCULAR Hx HTN  Demand ischemia Tachycardia  P:  Hold home cozaar Continue Metoprolol 12.5 BID  Trend BP  Cardiac Monitoring   RENAL AKI - mild  Severe Rhabdo. Improving.  Hypernatremia P:   Replace Electrolytes as needed  Free water 400 Q4H to be restarted after placement KVO IVF  Trend CK Total  Increase D5W to 100 ml/hr Check urine osm (?DI)  GASTROINTESTINAL Elevated transaminases - down-trending   P:   Trend LFT's  TF Pepcid  PEG tube per IR in AM, consult done  HEMATOLOGIC No active issue  P:  Trend CBC   SQ heparin   INFECTIOUS Aspiration PNA - will cover nosocomial as he has been visiting his wife frequently in SNF the last month -BC positive for gpc clusters  P:     Trend WBC and Fever Curve  Off abx, completed course Repeat BC noted, NTD  ENDOCRINE IDDM  Severe hypoglycemia -- unclear if suicide attempt given depression s/s, pictures strewn about the home . CBG better.  P:   Cont TF  SSI and Lantus and TF coverage   NEUROLOGIC AMS - r/t severe hypoglycemia with unknown down time.  Last seen normal 2 days prior to admit -EEG c/Andrew severe cerebral injury. -Poor prognosis need family goals of care  P:   Neurology following, appreciate input  RASS goal: 0 D/C fentanyl  FAMILY  - Updates:  No family bedside 12/3.  - Inter-disciplinary family meet or Palliative Care meeting due by:  11/29  The patient is critically ill with multiple organ systems failure and requires high complexity decision making for assessment and support, frequent evaluation and titration of therapies, application of advanced monitoring technologies and extensive interpretation of  multiple databases.   Critical Care Time devoted to patient care services described in this note is  35  Minutes. This time reflects time of care of this signee Dr Koren BoundWesam Klever [place]. This critical care time does not reflect procedure time, or teaching time or supervisory time of PA/NP/Med student/Med Resident etc but could involve care discussion time.  Alyson ReedyWesam G. Courtney Fenlon, M.D. [facility]/Critical Care Medicine. Pager: [phone]. After hours pager: [phone].

## 2016-11-19 NOTE — Progress Notes (Signed)
Dr Molli Knockyacoub notified of patient's blood sugars. Scale changed to resistant.

## 2016-11-20 DIAGNOSIS — R509 Fever, unspecified: Secondary | ICD-10-CM

## 2016-11-20 LAB — GLUCOSE, CAPILLARY
GLUCOSE-CAPILLARY: 273 mg/dL — AB (ref 65–99)
GLUCOSE-CAPILLARY: 306 mg/dL — AB (ref 65–99)
GLUCOSE-CAPILLARY: 310 mg/dL — AB (ref 65–99)
Glucose-Capillary: 211 mg/dL — ABNORMAL HIGH (ref 65–99)
Glucose-Capillary: 181 mg/dL — ABNORMAL HIGH (ref 65–99)
Glucose-Capillary: 266 mg/dL — ABNORMAL HIGH (ref 65–99)

## 2016-11-20 LAB — BASIC METABOLIC PANEL
Anion gap: 9 (ref 5–15)
BUN: 63 mg/dL — AB (ref 6–20)
CHLORIDE: 124 mmol/L — AB (ref 101–111)
CO2: 19 mmol/L — AB (ref 22–32)
CREATININE: 2.33 mg/dL — AB (ref 0.61–1.24)
Calcium: 7.9 mg/dL — ABNORMAL LOW (ref 8.9–10.3)
GFR calc Af Amer: 34 mL/min — ABNORMAL LOW (ref >60)
GFR calc non Af Amer: 29 mL/min — ABNORMAL LOW (ref >60)
Glucose, Bld: 218 mg/dL — ABNORMAL HIGH (ref 65–99)
Potassium: 3.5 mmol/L (ref 3.5–5.1)
Sodium: 152 mmol/L — ABNORMAL HIGH (ref 135–145)

## 2016-11-20 LAB — CULTURE, BLOOD (ROUTINE X 2): CULTURE: NO GROWTH

## 2016-11-20 LAB — URINALYSIS, ROUTINE W REFLEX MICROSCOPIC
BILIRUBIN URINE: NEGATIVE
GLUCOSE, UA: 250 mg/dL — AB
KETONES UR: NEGATIVE mg/dL
Nitrite: NEGATIVE
PH: 5 (ref 5.0–8.0)
Protein, ur: NEGATIVE mg/dL
Specific Gravity, Urine: 1.016 (ref 1.005–1.030)

## 2016-11-20 LAB — URINE MICROSCOPIC-ADD ON

## 2016-11-20 LAB — PHOSPHORUS: Phosphorus: 3.3 mg/dL (ref 2.5–4.6)

## 2016-11-20 LAB — CBC
HCT: 35.6 % — ABNORMAL LOW (ref 39.0–52.0)
Hemoglobin: 10.8 g/dL — ABNORMAL LOW (ref 13.0–17.0)
MCH: 28.5 pg (ref 26.0–34.0)
MCHC: 30.3 g/dL (ref 30.0–36.0)
MCV: 93.9 fL (ref 78.0–100.0)
PLATELETS: 142 10*3/uL — AB (ref 150–400)
RBC: 3.79 MIL/uL — AB (ref 4.22–5.81)
RDW: 16.5 % — ABNORMAL HIGH (ref 11.5–15.5)
WBC: 16.7 10*3/uL — ABNORMAL HIGH (ref 4.0–10.5)

## 2016-11-20 LAB — MAGNESIUM: Magnesium: 2.5 mg/dL — ABNORMAL HIGH (ref 1.7–2.4)

## 2016-11-20 LAB — PROCALCITONIN: PROCALCITONIN: 2.28 ng/mL

## 2016-11-20 MED ORDER — RANITIDINE HCL 150 MG/10ML PO SYRP
150.0000 mg | ORAL_SOLUTION | Freq: Every day | ORAL | Status: DC
  Administered 2016-11-20 – 2016-11-23 (×4): 150 mg
  Filled 2016-11-20 (×4): qty 10

## 2016-11-20 MED ORDER — HEPARIN SODIUM (PORCINE) 5000 UNIT/ML IJ SOLN: 5000.0000 [IU] | Freq: Three times a day (TID) | INTRAMUSCULAR | Status: DC

## 2016-11-20 MED ORDER — INSULIN NPH (HUMAN) (ISOPHANE) 100 UNIT/ML ~~LOC~~ SUSP
25.0000 [IU] | Freq: Two times a day (BID) | SUBCUTANEOUS | Status: DC
  Administered 2016-11-21 (×2): 25 [IU] via SUBCUTANEOUS
  Filled 2016-11-20: qty 10

## 2016-11-20 MED ORDER — INSULIN ASPART 100 UNIT/ML ~~LOC~~ SOLN
0.0000 [IU] | SUBCUTANEOUS | Status: DC
  Administered 2016-11-20: 11 [IU] via SUBCUTANEOUS
  Administered 2016-11-20: 8 [IU] via SUBCUTANEOUS
  Administered 2016-11-20: 11 [IU] via SUBCUTANEOUS
  Administered 2016-11-21: 5 [IU] via SUBCUTANEOUS
  Administered 2016-11-21: 8 [IU] via SUBCUTANEOUS
  Administered 2016-11-21: 11 [IU] via SUBCUTANEOUS
  Administered 2016-11-21: 3 [IU] via SUBCUTANEOUS
  Administered 2016-11-21: 8 [IU] via SUBCUTANEOUS
  Administered 2016-11-22: 5 [IU] via SUBCUTANEOUS
  Administered 2016-11-22: 8 [IU] via SUBCUTANEOUS
  Administered 2016-11-22: 11 [IU] via SUBCUTANEOUS

## 2016-11-20 MED ORDER — ACETAMINOPHEN 160 MG/5ML PO SOLN
650.0000 mg | Freq: Four times a day (QID) | ORAL | Status: DC | PRN
  Administered 2016-11-20 – 2016-11-23 (×4): 650 mg
  Filled 2016-11-20 (×5): qty 20.3

## 2016-11-20 MED ORDER — METOPROLOL TARTRATE 25 MG/10 ML ORAL SUSPENSION
25.0000 mg | Freq: Two times a day (BID) | ORAL | Status: DC
  Administered 2016-11-20 – 2016-11-24 (×8): 25 mg
  Filled 2016-11-20 (×8): qty 10

## 2016-11-20 MED ORDER — SODIUM CHLORIDE 0.45 % IV SOLN
INTRAVENOUS | Status: DC
  Administered 2016-11-20 – 2016-11-21 (×2): via INTRAVENOUS

## 2016-11-20 MED ORDER — FENTANYL CITRATE (PF) 100 MCG/2ML IJ SOLN
25.0000 ug | INTRAMUSCULAR | Status: DC | PRN
  Administered 2016-11-20 – 2016-11-24 (×6): 100 ug via INTRAVENOUS
  Filled 2016-11-20 (×6): qty 2

## 2016-11-20 NOTE — Clinical Social Work Note (Signed)
Clinical Social Work Assessment  Patient Details  Name: Andrew [name] MRN: [id number] Date of Birth: 02/28/1956  Date of referral:  11/20/16               Reason for consult:  Facility Placement                Permission sought to share information with:  Family Supports, Magazine features editoracility Contact Representative Permission granted to share information::  No (Patient presently unresponsive and on the Vent)  Name::     Andrew [name] (Daughter) [phone]; Dennis BastJonita [name] (Spouse) [phone]  Agency::     Relationship::     Contact Information:     Housing/Transportation Living arrangements for the past 2 months:  Apartment Source of Information:  Spouse, Adult Children Patient Interpreter Needed:  None Criminal Activity/Legal Involvement Pertinent to Current Situation/Hospitalization:  No - Comment as needed Significant Relationships:  Adult Children, Spouse Lives with:  Spouse Do you feel safe going back to the place where you live?  No Need for family participation in patient care:  Yes (Comment)  Care giving concerns:  Prior to admission, Patient was independent at home with home health services. Patient is presently on the vent/trach and will need Vent/Trach SNF placement at discharge. Patient's family hopeful that Patient will be able to wean off of the ventilator and be placed in a local Trach SNF facility.    Social Worker assessment / plan:  Patient is a 60 yo male with hx IDDM, GERD, HTN, OSA presented 11/22 after being found unresponsive at home for unknown period of time.  He lives with his wife but [facility].  Was last seen normal 2 days prior although family is trying to determine if his home health aide saw him in the interim. Of note, daughter concerned that he has been very depressed the last few weeks.  There were family pictures strewn about the house when family arrived. CSW engaged with Patient's wife and Patient's daughter as Patient  is presently intubated and unresponsive. CSW introduced self, role of CSW, and began discussion of Vent/Trach SNF placement. CSW explained that options are limited in the state for Vent SNF's and that Patient may end up out of state. Patient's family adamant that they don not want to have Patient placed out of state. Prior to admission, Patient was independent at home with home health services. Patient's family hopeful that Patient will be able to wean off of the ventilator and be placed in a local Trach SNF facility. Patient's family agreeable for CSW to begin referral process once Patient is more medically stable. At present, Patient is have large amount of secretions and is having to be suctioned every hour per RN. Most facilities will not accept a Patient requiring suctioning more frequently than every 4 hours.    Employment status:  Disabled (Comment on whether or not currently receiving Disability) Insurance information:  Medicare PT Recommendations:  Not assessed at this time Information / Referral to community resources:  Other (Comment Required), Skilled Nursing Facility (Vent/Trach Skilled Nursing Facilities)  Patient/Family's Response to care:  Patient's family appreciative of care received at this time. Patient's family would like for all medical efforts to be continued and are hopeful that Patient will be able to be extubated.   Patient/Family's Understanding of and Emotional Response to Diagnosis, Current Treatment, and Prognosis:  Patient's family reports an understanding of current diagnosis and treatment. Patient's family appears to  not fully understand Patient's prognosis. Patient's family not presently open to out of state placement if Patient remains on the ventilator. Family would like for CSW to [facility] facilities. Patient's family hopeful that Patient will be able to wean off of the ventilator and be placed in a local Trach SNF facility.    Emotional Assessment Appearance:  Appears stated age Attitude/Demeanor/Rapport:  Unable to Assess Affect (typically observed):  Unable to Assess Orientation:   (Unable to Assess- Patient unresponsive and on Vent) Alcohol / Substance use:  Not Applicable Psych involvement (Current and /or in the community):  No (Comment)  Discharge Needs  Concerns to be addressed:  Discharge Planning Concerns, Care Coordination Readmission within the last 30 days:  No Current discharge risk:  None Barriers to Discharge:  Continued Medical Work up   [place], LCSW 11/20/2016, 2:59 PM

## 2016-11-20 NOTE — Progress Notes (Addendum)
Patient ID: Andrew [name], male   DOB: [birth year], 60 y.o.   MRN: [id number]   Scheduled for perc G tube in IR when appropriate   Wbc 16.7 today tmax 100.9  Need wnl or trending down----will check in am Plan for poss 12/5  We will follow chart Plan asap

## 2016-11-20 NOTE — Progress Notes (Signed)
Sputum sample obtained and sent down to main lab without complications.  

## 2016-11-20 NOTE — Progress Notes (Signed)
PULMONARY / CRITICAL CARE MEDICINE   Name: Andrew [name] MRN: [id number] DOB: [birth year]    ADMISSION DATE:  11/08/2016  REFERRING MD:  EDP   CHIEF COMPLAINT:  AMS, respiratory failure, aspiration   Brief:  60 y.o. male with hx IDDM, GERD, HTN, OSA presented 11/22 after being found unresponsive at home for unknown period of time.  He lives with his wife but she has been in SNF for the last month.  Was last seen normal 2 days prior although family is trying to determine if his home health aide saw him in the interim.  Was found to have glucose 21, no improvement in mental status with D50.  He was bagged by EMS en route and had multiple episodes of vomiting with obvious aspiration.  Intubated on arrival to ER and PCCM called to admit.  Of note, daughter concerned that he has been very depressed the last few weeks.  There were family pictures strewn about the house when family arrived.  He has had little to no PO intake but continues to take his insulin per family.      SUBJECTIVE:  No acute events overnight. Patient did have fever to 102F this morning. Patient is having copious secretions and trouble weaning from ventilator.   REVIEW OF SYSTEMS:  Unable to obtain with patient intubated.   VITAL SIGNS: BP (!) 102/55   Pulse (!) 116   Temp (!) 101.8 F (38.8 C) (Oral)   Resp (!) 27   Ht 6' (1.829 m)   Wt 223 lb 1.7 oz (101.2 kg)   SpO2 97%   BMI 30.26 kg/m   HEMODYNAMICS:    VENTILATOR SETTINGS: Vent Mode: PRVC FiO2 (%):  [30 %] 30 % Set Rate:  [18 bmp] 18 bmp Vt Set:  [620 mL] 620 mL PEEP:  [5 cmH20] 5 cmH20 Pressure Support:  [5 cmH20] 5 cmH20 Plateau Pressure:  [18 cmH20-21 cmH20] 20 cmH20  INTAKE / OUTPUT: I/O last 3 completed shifts: In: 5760 [I.V.:2080; NG/GT:3580; IV Piggyback:100] Out: 5100 [Urine:4400; Stool:700]  PHYSICAL EXAMINATION: General:  No distress. No family at bedside.  Neuro:  Triple flexion bilateral lower extremities. Doesn't follow commands.  Questionable withdrawal to pain in upper extremities.  HEENT:  ETT in place. No scleral icterus or injection. Opaque corneas. Cardiovascular:  Regular rhythm. Mild edema. No appreciable JVD. Pulmonary:  Symmetric chest rise on ventilator. Coarse breath sounds bilaterally. Abdomen:  Soft. Protuberant. Normoactive bowel sounds. Integument:  Warm & dry. No rash on exposed skin.   LABS:  BMET  Recent Labs Lab 11/18/16 0500 11/19/16 0445 11/20/16 0430  NA 160* 159* 152*  K 3.7 3.7 3.5  CL 130* 129* 124*  CO2 22 22 19*  BUN 64* 65* 63*  CREATININE 2.52* 2.56* 2.33*  GLUCOSE 165* 251* 218*   Electrolytes  Recent Labs Lab 11/18/16 0500 11/19/16 0445 11/20/16 0430  CALCIUM 9.0 8.6* 7.9*  MG 2.6* 2.7* 2.5*  PHOS 4.0 3.9 3.3   CBC  Recent Labs Lab 11/18/16 0500 11/19/16 0445 11/20/16 0430  WBC 17.7* 17.7* 16.7*  HGB 12.5* 11.5* 10.8*  HCT 40.1 37.8* 35.6*  PLT 156 134* 142*   Coag's  Recent Labs Lab 11/17/16 0500  INR 1.24   Sepsis Markers No results for input(s): LATICACIDVEN, PROCALCITON, O2SATVEN in the last 168 hours.  ABG  Recent Labs Lab 11/15/16 0354 11/18/16 0425  PHART 7.486* 7.530*  PCO2ART 28.1* 25.1*  PO2ART 126* 92.4   Liver Enzymes No results for input(s):  AST, ALT, ALKPHOS, BILITOT, ALBUMIN in the last 168 hours.  Cardiac Enzymes No results for input(s): TROPONINI, PROBNP in the last 168 hours.  Glucose  Recent Labs Lab 11/19/16 1650 11/19/16 2005 11/20/16 0000 11/20/16 0330 11/20/16 0810 11/20/16 1201  GLUCAP 308* 305* 284* 211* 181* 273*    Imaging   STUDIES:  EEG 11/22: severe diffuse dysfxn CT Head 11/22:  Nothing acute.  MRI Brain 11/27: Mild atrophy and white matter disease is slightly advanced for age. This likely reflects the sequela of chronic microvascular ischemia  MICROBIOLOGY: MRSA PCR 11/22:  Negative Urine Ctx 11/22:  Negative Blood Ctx x2 11/22:  Negative Blood Ctx x2 11/29 >> Coag Neg Staph  1/2 Blood Ctx x2 11/30 >>  ANTIBIOTICS: Zosyn 11/22 - 11/27 Vancomycin 11/30 - 12/3  SIGNIFICANT EVENTS: 11/22 - admit after being found unresponsive. Hypoglycemia proloned down time.   LINES/TUBES: ETT 11/22 - 12/1 Trach #6 Ninetta Lights(JY) 12/1 >>> RUE TL PICC 11/23 >>> FOLEY >>> L NGT 12/2 >>>  ASSESSMENT / PLAN:  ENDOCRINE A: Severe Hypoglycemia - Resolved w/ D5W. Now hyperglycemic. H/O IDDM  P:   D/C D5W Change to Moderate Dose SSI per algorithm Continuing Accu-Checks q4hr D/C BID Lantus Starting NPH 25u [place] q12hr tomorrow AM  NEUROLOGIC A: Acute Encephalopathy - Likely due to severe hypoglycemia w/ unknown downtime. Last seen normal 2 days prior to admit. Anoxic Brain Injury  Sedation on Ventilator   P:   Desired RASS:  0 Neurology Previously Consulted - Now signed off. Last seen 11/29. Fentanyl IV prn Pain Holding sedation   PULMONARY A: Acute Hypoxic Respiratory Failure - Not tolerating wean. Copious Tracheal Secretions H/O OSA  P:   Full Vent Support Plan for Portable CXR in AM Holding on SBT until mental status improves Continuing PS wean  CARDIOVASCULAR A: Elevated Troponin I - Likely demand ischemia.  Sinus Tachycardia H/O HTN H/O Hyperlipidemia   P:  Continuous telemetry monitoring Vitals per unit protocol Lopressor 12.5mg  VT bid Holding home Cozaar Goal MAP >65  RENAL A: Acute Renal Failure - Improving. Severe Rhabdomyolysis - Improving. Hypernatremia - Improving.   P:   Monitoring UOP Trending electrolytes & renal function daily Replacing electrolytes as indicated Continuing Free Water 400cc q4hr VT D/C D5W Repeat CK tomorrow AM  GASTROINTESTINAL A: Transaminitis - Improving. Likely due to hypoglycemia. H/O GERD  P:   NPO Continuing Tube Feedings PEG tube by IR pending D/C Pepcid Starting Zantac 150mg  qhs Repeat LFTs in AM  HEMATOLOGIC A: Leukocytosis - Improving. Unclear etiology. Anemia - Likely dilutional. No  evidence of active bleeding. Thrombocytopenia - Likely dilutional.  P:  Trending cell counts daily w/ CBC SCDs Heparin [place] q8hr  INFECTIOUS A: FUO  Aspiration Pneumonia - S/P Treatment with Vancomycin & Zosyn.   P:     Holding on antibiotics Checking Blood & Urine Cultures. Also checking U/A & Tracheal Aspirate Ctx. Trending Procalcitonin per Algorithm   FAMILY  - Updates:  No family bedside 12/4. Wife Jonita updated 12/4 by Dr. Jamison NeighborNestor via phone.   TODAY'S SUMMARY:  60 y.o. male with severe hypoglycemia and persistent AMS and acute respiratory failure with obvious aspiration. Mental status remains poor. Re-culturing given fever. Holding off on antibiotics for now. Will need a vent SNF vs LTAC placement.   I have spent a total of 41 minutes of critical care time today caring for the patient, updating his wife via phone, and reviewing the patient's electronic medical record.   Donna ChristenJennings E. Nestor,  M.D. [facility] Pager:  [phone] After 3pm or if no response, call [phone] 1:56 PM 11/20/16

## 2016-11-20 NOTE — NC FL2 (Signed)
[place] MEDICAID FL2 LEVEL OF CARE SCREENING TOOL     IDENTIFICATION  Patient Name: Andrew [name] Birthdate: [birth year] Sex: male Admission Date (Current Location): 11/08/2016  [facility] and [place] Number:  Herbalist and Address:  [facility], [place] [address] [place], [place] [address]      Provider Number: [id number]  Attending Physician Name and Address:  Raylene Miyamoto, MD  Relative Name and Phone Number:  Dalia Heading (Daughter) [phone]    Current Level of Care: Hospital Recommended Level of Care: Vent SNF Prior Approval Number:    Date Approved/Denied:   PASRR Number:   [id number]  Discharge Plan: SNF (VENT SNF)    Current Diagnoses: Patient Active Problem List   Diagnosis Date Noted  . Acute encephalopathy   . Acute respiratory failure with hypoxia ([place])   . Anoxic encephalopathy ([place])   . Acute respiratory failure ([place])   . AKI (acute kidney injury) ([place])   . Aspiration pneumonia of both lower lobes due to vomit ([place])   . Hypoglycemia 11/08/2016  . Encephalopathy   . SOB (shortness of breath)   . Encounter for orogastric (OG) tube placement   . Allergic rhinitis, cause unspecified 09/04/2013  . 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 Type 1 diabetes mellitus ([place]) 09/03/2013  . Rapid heart rate 08/20/2013  . Morbid obesity ([place]) 07/13/2012  . Cough 06/10/2012  . Osteoarthritis 04/03/2011    Orientation RESPIRATION BLADDER Height & Weight      (UNABLE TO ASSESS- PT. ON VENT/TRACH)  Tracheostomy, Vent (Trach 48m shiley cuffed; FiO2 30%) Continent, Indwelling catheter Weight: 223 lb 1.7 oz (101.2 kg) Height:  6' (182.9 cm)  BEHAVIORAL SYMPTOMS/MOOD NEUROLOGICAL BOWEL NUTRITION STATUS   (NONE)   Continent (Rectal Tube Pouch) NG/panda (Pro-Stat Sugar Free 64 liquid 60 mL 4x/daily; VITAL High Protein liquid 1000 mL every 24 hrs)  AMBULATORY STATUS COMMUNICATION OF NEEDS Skin   Total Care Verbally  (PATIENT ON VENT/TRACH) Normal                       Personal Care Assistance Level of Assistance  Bathing, Feeding, Dressing, Total care Bathing Assistance: Maximum assistance Feeding assistance: Maximum assistance Dressing Assistance: Maximum assistance Total Care Assistance: Maximum assistance   Functional Limitations Info  Sight, Hearing, Speech Sight Info: 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) Hearing Info: Adequate Speech Info: Adequate    SPECIAL CARE FACTORS FREQUENCY                       Contractures Contractures Info: Not present    Additional Factors Info  Code Status, Allergies, Suctioning Needs, Insulin Sliding Scale Code Status Info: FULL Allergies Info: Lisinopril   Insulin Sliding Scale Info: Lantus injection 25 units 2x daily; NovoLOG injection 4 units every 4 hours; novoLOG injection 0-20 units every 4 hours   Suctioning Needs: Tracheal Suction- moderate secretions   Current Medications (11/20/2016):  This is the current hospital active medication list Current Facility-Administered Medications  Medication Dose Route Frequency Provider Last Rate Last Dose  . albuterol (PROVENTIL) (2.5 MG/3ML) 0.083% nebulizer solution 2.5 mg  2.5 mg Nebulization Q2H PRN KMarijean Heath NP   2.5 mg at 11/16/16 0435  . chlorhexidine gluconate (MEDLINE KIT) (PERIDEX) 0.12 % solution 15 mL  15 mL Mouth Rinse BID DRaylene Miyamoto MD   15 mL at 11/20/16 0900  . feeding supplement (PRO-STAT SUGAR FREE 64) liquid 60  mL  60 mL Oral QID Jose Shirl Harris, MD   60 mL at 11/20/16 0947  . feeding supplement (VITAL HIGH PROTEIN) liquid 1,000 mL  1,000 mL Per Tube Q24H Nicolette Bang, DO   1,000 mL at 11/19/16 1841  . fentaNYL (SUBLIMAZE) injection 25-100 mcg  25-100 mcg Intravenous Q1H PRN Javier Glazier, MD      . free water 400 mL  400 mL Per Tube Q4H Colbert Coyer, MD   400 mL at 11/20/16 1200  . heparin injection 5,000 Units  5,000 Units Subcutaneous  Q8H Marijean Heath, NP   5,000 Units at 11/20/16 0525  . insulin aspart (novoLOG) injection 0-15 Units  0-15 Units Subcutaneous Q4H Javier Glazier, MD      . Derrill Memo ON 11/21/2016] insulin NPH Human (HUMULIN N,NOVOLIN N) injection 25 Units  25 Units Subcutaneous Q12H Javier Glazier, MD      . MEDLINE mouth rinse  15 mL Mouth Rinse QID Raylene Miyamoto, MD   15 mL at 11/20/16 1244  . metoprolol (LOPRESSOR) injection 5 mg  5 mg Intravenous Q4H PRN Omar Person, NP   5 mg at 11/13/16 1720  . metoprolol tartrate (LOPRESSOR) 25 mg/10 mL oral suspension 25 mg  25 mg Per Tube BID Javier Glazier, MD      . ranitidine (ZANTAC) 150 MG/10ML syrup 150 mg  150 mg Per Tube QHS Javier Glazier, MD      . sodium chloride flush (NS) 0.9 % injection 10-40 mL  10-40 mL Intracatheter Q12H Raylene Miyamoto, MD   20 mL at 11/20/16 1000  . sodium chloride flush (NS) 0.9 % injection 10-40 mL  10-40 mL Intracatheter PRN Raylene Miyamoto, MD   10 mL at 11/18/16 0912     Discharge Medications: Please see discharge summary for a list of discharge medications.  Relevant Imaging Results:  Relevant Lab Results:   Additional Information SS #[id number]  Judeth Horn, LCSW

## 2016-11-21 ENCOUNTER — Inpatient Hospital Stay (HOSPITAL_COMMUNITY): Payer: Medicare Other

## 2016-11-21 LAB — CK: Total CK: 1031 U/L — ABNORMAL HIGH (ref 49–397)

## 2016-11-21 LAB — HEPATIC FUNCTION PANEL
ALBUMIN: 1.8 g/dL — AB (ref 3.5–5.0)
ALT: 162 U/L — ABNORMAL HIGH (ref 17–63)
AST: 86 U/L — AB (ref 15–41)
Alkaline Phosphatase: 203 U/L — ABNORMAL HIGH (ref 38–126)
BILIRUBIN TOTAL: 1 mg/dL (ref 0.3–1.2)
Bilirubin, Direct: 0.3 mg/dL (ref 0.1–0.5)
Indirect Bilirubin: 0.7 mg/dL (ref 0.3–0.9)
Total Protein: 6.4 g/dL — ABNORMAL LOW (ref 6.5–8.1)

## 2016-11-21 LAB — RENAL FUNCTION PANEL
ANION GAP: 6 (ref 5–15)
Albumin: 1.7 g/dL — ABNORMAL LOW (ref 3.5–5.0)
BUN: 59 mg/dL — ABNORMAL HIGH (ref 6–20)
CALCIUM: 7.7 mg/dL — AB (ref 8.9–10.3)
CHLORIDE: 122 mmol/L — AB (ref 101–111)
CO2: 21 mmol/L — AB (ref 22–32)
Creatinine, Ser: 2.43 mg/dL — ABNORMAL HIGH (ref 0.61–1.24)
GFR calc non Af Amer: 27 mL/min — ABNORMAL LOW (ref >60)
GFR, EST AFRICAN AMERICAN: 32 mL/min — AB (ref >60)
Glucose, Bld: 259 mg/dL — ABNORMAL HIGH (ref 65–99)
Phosphorus: 3.2 mg/dL (ref 2.5–4.6)
Potassium: 3.4 mmol/L — ABNORMAL LOW (ref 3.5–5.1)
SODIUM: 149 mmol/L — AB (ref 135–145)

## 2016-11-21 LAB — CULTURE, BLOOD (ROUTINE X 2)
Culture: NO GROWTH
Culture: NO GROWTH

## 2016-11-21 LAB — CBC WITH DIFFERENTIAL/PLATELET
BASOS ABS: 0 10*3/uL (ref 0.0–0.1)
Basophils Relative: 0 %
EOS PCT: 2 %
Eosinophils Absolute: 0.3 10*3/uL (ref 0.0–0.7)
HCT: 32 % — ABNORMAL LOW (ref 39.0–52.0)
HEMOGLOBIN: 9.9 g/dL — AB (ref 13.0–17.0)
LYMPHS ABS: 2.1 10*3/uL (ref 0.7–4.0)
LYMPHS PCT: 13 %
MCH: 28.4 pg (ref 26.0–34.0)
MCHC: 30.9 g/dL (ref 30.0–36.0)
MCV: 92 fL (ref 78.0–100.0)
Monocytes Absolute: 1.6 10*3/uL — ABNORMAL HIGH (ref 0.1–1.0)
Monocytes Relative: 10 %
NEUTROS ABS: 11.5 10*3/uL — AB (ref 1.7–7.7)
NEUTROS PCT: 75 %
PLATELETS: 146 10*3/uL — AB (ref 150–400)
RBC: 3.48 MIL/uL — AB (ref 4.22–5.81)
RDW: 16.3 % — ABNORMAL HIGH (ref 11.5–15.5)
WBC: 15.6 10*3/uL — AB (ref 4.0–10.5)

## 2016-11-21 LAB — GLUCOSE, CAPILLARY
GLUCOSE-CAPILLARY: 224 mg/dL — AB (ref 65–99)
GLUCOSE-CAPILLARY: 299 mg/dL — AB (ref 65–99)
GLUCOSE-CAPILLARY: 330 mg/dL — AB (ref 65–99)
Glucose-Capillary: 191 mg/dL — ABNORMAL HIGH (ref 65–99)
Glucose-Capillary: 252 mg/dL — ABNORMAL HIGH (ref 65–99)

## 2016-11-21 LAB — MAGNESIUM: Magnesium: 2.4 mg/dL (ref 1.7–2.4)

## 2016-11-21 LAB — URINE CULTURE
CULTURE: NO GROWTH
SPECIAL REQUESTS: NORMAL

## 2016-11-21 LAB — PROTIME-INR
INR: 1.32
Prothrombin Time: 16.5 seconds — ABNORMAL HIGH (ref 11.4–15.2)

## 2016-11-21 LAB — PROCALCITONIN: Procalcitonin: 3.9 ng/mL

## 2016-11-21 MED ORDER — POTASSIUM CHLORIDE 20 MEQ/15ML (10%) PO SOLN
40.0000 meq | Freq: Once | ORAL | Status: AC
Start: 2016-11-21 — End: 2016-11-21
  Administered 2016-11-21: 40 meq
  Filled 2016-11-21: qty 30

## 2016-11-21 MED ORDER — HEPARIN SODIUM (PORCINE) 5000 UNIT/ML IJ SOLN
5000.0000 [IU] | Freq: Three times a day (TID) | INTRAMUSCULAR | Status: DC
  Administered 2016-11-21 – 2016-11-24 (×10): 5000 [IU] via SUBCUTANEOUS
  Filled 2016-11-21 (×10): qty 1

## 2016-11-21 MED ORDER — CEFEPIME HCL 2 G IJ SOLR
2.0000 g | INTRAMUSCULAR | Status: DC
  Administered 2016-11-21 – 2016-11-22 (×2): 2 g via INTRAVENOUS
  Filled 2016-11-21 (×3): qty 2

## 2016-11-21 NOTE — Progress Notes (Signed)
Inpatient Diabetes Program Recommendations  AACE/ADA: New Consensus Statement on Inpatient Glycemic Control (2015)  Target Ranges:  Prepandial:   less than 140 mg/dL      Peak postprandial:   less than 180 mg/dL (1-2 hours)      Critically ill patients:  140 - 180 mg/dL   Lab Results  Component Value Date   GLUCAP 252 (H) 11/21/2016   HGBA1C 7.9 10/09/2016   Results for Sherran NeedsLATHAM, Ulices R (MRN [id number]) as of 11/21/2016 12:32  Ref. Range 11/20/2016 21:10 11/20/2016 23:13 11/21/2016 03:37 11/21/2016 08:15 11/21/2016 11:41  Glucose-Capillary Latest Ref Range: 65 - 99 mg/dL 409310 (H) 811266 (H) 914224 (H) 191 (H) 252 (H)   Review of Glycemic Control  Blood sugars > goal. Needs TF coverage.  Inpatient Diabetes Program Recommendations:    Novolog 4 units Q4H and titrate until CBGs < 180 mg/dL. Consider Lantus since pt was on this at home.  Continue to follow. Thank you. Ailene Ardshonda Jacquel Redditt, RD, LDN, CDE Inpatient Diabetes Coordinator [phone]

## 2016-11-21 NOTE — Progress Notes (Signed)
eLink Physician-Brief Progress Note Patient Name: Andrew NeedsJohnny R [name] DOB: 02-Aug-1956 MRN: [id number]   Date of Service  11/21/2016  HPI/Events of Note    eICU Interventions  Hypokalemia, repleted      Intervention Category Intermediate Interventions: Electrolyte abnormality - evaluation and management  Max FickleDouglas McQuaid 11/21/2016, 6:24 AM

## 2016-11-21 NOTE — Progress Notes (Signed)
Patient ID: Andrew NeedsJohnny R [name], male   DOB: [birth year], 60 y.o.   MRN: [id number]   [facility] Deanne CofferHassell

## 2016-11-21 NOTE — Progress Notes (Signed)
PCCM Attending Note: Spoke with patient's daughter Marcelle Smilingatasha via phone. Explained that the patient's chance for any meaningful recovery is nearly impossible. We discussed the fact that he is not appropriate for an LTAC and why as well as the fact that the closest ventilator skilled nursing facility [place] in [place]. She is going to try to meet with me with her mother tomorrow to discuss our goals of care.  Donna ChristenJennings E. Jamison NeighborNestor, M.D. [facility] Pager:  [phone] After 3pm or if no response, call [phone] 4:15 PM 11/21/16

## 2016-11-21 NOTE — Progress Notes (Signed)
PULMONARY / CRITICAL CARE MEDICINE   Name: Andrew [name] MRN: [id number] DOB: 06-10-1956    ADMISSION DATE:  11/08/2016  REFERRING MD:  EDP   CHIEF COMPLAINT:  AMS, respiratory failure, aspiration   Brief:  60 y.o. male with hx IDDM, GERD, HTN, OSA presented 11/22 after being found unresponsive at home for unknown period of time.  He lives with his wife but she has been in SNF for the last month.  Was last seen normal 2 days prior although family is trying to determine if his home health aide saw him in the interim.  Was found to have glucose 21, no improvement in mental status with D50.  He was bagged by EMS en route and had multiple episodes of vomiting with obvious aspiration.  Intubated on arrival to ER and PCCM called to admit.  Of note, daughter concerned that he has been very depressed the last few weeks.  There were family pictures strewn about the house when family arrived.  He has had little to no PO intake but continues to take his insulin per family.      SUBJECTIVE:  No acute events overnight. Still having intermittent fevers.   REVIEW OF SYSTEMS:  Unable to obtain with patient intubated.   VITAL SIGNS: BP 109/62   Pulse (!) 104   Temp (!) 101.4 F (38.6 C) (Oral)   Resp (!) 25   Ht 6' (1.829 m)   Wt 230 lb 6.1 oz (104.5 kg)   SpO2 98%   BMI 31.25 kg/m   HEMODYNAMICS:    VENTILATOR SETTINGS: Vent Mode: PRVC FiO2 (%):  [30 %] 30 % Set Rate:  [18 bmp] 18 bmp Vt Set:  [620 mL] 620 mL PEEP:  [5 cmH20] 5 cmH20 Plateau Pressure:  [19 cmH20-25 cmH20] 19 cmH20  INTAKE / OUTPUT: I/O last 3 completed shifts: In: 4820 [I.V.:1980; NG/GT:2840] Out: 4275 [Urine:3775; Stool:500]  PHYSICAL EXAMINATION: General:  No distress. No family at bedside. On ventilator. Neuro:  Doesn't follow commands. Opens eyes intermittently.  HEENT:  ETT in place. No scleral icterus or injection. Opaque corneas. Cardiovascular:  Regular rhythm. Mild edema. No appreciable JVD. Pulmonary:   Symmetric chest rise on ventilator. Coarse breath sounds bilaterally. Abdomen:  Soft. Protuberant. Normoactive bowel sounds. Integument:  Warm & dry. No rash on exposed skin.   LABS:  BMET  Recent Labs Lab 11/19/16 0445 11/20/16 0430 11/21/16 0340  NA 159* 152* 149*  K 3.7 3.5 3.4*  CL 129* 124* 122*  CO2 22 19* 21*  BUN 65* 63* 59*  CREATININE 2.56* 2.33* 2.43*  GLUCOSE 251* 218* 259*   Electrolytes  Recent Labs Lab 11/19/16 0445 11/20/16 0430 11/21/16 0340  CALCIUM 8.6* 7.9* 7.7*  MG 2.7* 2.5* 2.4  PHOS 3.9 3.3 3.2   CBC  Recent Labs Lab 11/19/16 0445 11/20/16 0430 11/21/16 0340  WBC 17.7* 16.7* 15.6*  HGB 11.5* 10.8* 9.9*  HCT 37.8* 35.6* 32.0*  PLT 134* 142* 146*   Coag's  Recent Labs Lab 11/17/16 0500 11/21/16 0340  INR 1.24 1.32   Sepsis Markers  Recent Labs Lab 11/20/16 1432 11/21/16 0340  PROCALCITON 2.28 3.90    ABG  Recent Labs Lab 11/15/16 0354 11/18/16 0425  PHART 7.486* 7.530*  PCO2ART 28.1* 25.1*  PO2ART 126* 92.4   Liver Enzymes  Recent Labs Lab 11/21/16 0340  AST 86*  ALT 162*  ALKPHOS 203*  BILITOT 1.0  ALBUMIN 1.8*  1.7*    Cardiac Enzymes No results for  input(s): TROPONINI, PROBNP in the last 168 hours.  Glucose  Recent Labs Lab 11/20/16 1201 11/20/16 1537 11/20/16 2110 11/20/16 2313 11/21/16 0337 11/21/16 0815  GLUCAP 273* 306* 310* 266* 224* 191*    Imaging   STUDIES:  EEG 11/22: severe diffuse dysfxn CT Head 11/22:  Nothing acute.  MRI Brain 11/27: Mild atrophy and white matter disease is slightly advanced for age. This likely reflects the sequela of chronic microvascular ischemia  MICROBIOLOGY: MRSA PCR 11/22:  Negative Urine Ctx 11/22:  Negative Blood Ctx x2 11/22:  Negative Blood Ctx x2 11/29:  1/2 Coag Negative Staph  Blood Ctx x2 11/30 >> Blood Ctx x2 12/4 >> Tracheal Asp Ctx 12/4 >> Urine Ctx 12/4 >>  ANTIBIOTICS: Zosyn 11/22 - 11/27 Vancomycin 11/30 - 12/3 Cefepime  12/5 >>  SIGNIFICANT EVENTS: 11/22 - admit after being found unresponsive. Hypoglycemia proloned down time.   LINES/TUBES: ETT 11/22 - 12/1 Trach #6 Ninetta Lights(JY) 12/1 >>> RUE TL PICC 11/23 >>> FOLEY >>> L NGT 12/2 >>>  ASSESSMENT / PLAN:  ENDOCRINE A: Severe Hypoglycemia - Resolved w/ D5W. Now hyperglycemic. H/O IDDM  P:   Moderate Dose SSI per algorithm Accu-Checks q4hr NPH 25u [place] q12hr   NEUROLOGIC A: Acute Encephalopathy - Likely due to severe hypoglycemia w/ unknown downtime. Last seen normal 2 days prior to admit. Anoxic Brain Injury  Sedation on Ventilator   P:   Desired RASS:  0 Neurology Previously Consulted - Now signed off. Last seen 11/29. Fentanyl IV prn Pain Holding sedation   PULMONARY A: Acute Hypoxic Respiratory Failure - Not tolerating wean. Copious Tracheal Secretions H/O OSA  P:   Full Vent Support Intermittent ABG & Port CXR Holding on SBT until mental status improves Continuing PS wean  CARDIOVASCULAR A: Elevated Troponin I - Likely demand ischemia.  Sinus Tachycardia H/O HTN H/O Hyperlipidemia   P:  Continuous telemetry monitoring Vitals per unit protocol Lopressor 12.5mg  VT bid Holding home Cozaar Goal MAP >65  RENAL A: Acute Renal Failure - Improving. Severe Rhabdomyolysis - Improving. Hypernatremia - Improving.  Hypokalemia - Replaced.  P:   Monitoring UOP Trending electrolytes & renal function daily Replacing electrolytes as indicated Continuing Free Water 400cc q4hr VT KCl 40mEq VT x1  GASTROINTESTINAL A: Transaminitis - Improving. Likely due to hypoglycemia. H/O GERD  P:   NPO Continuing Tube Feedings PEG tube by IR pending Zantac 150mg  qhs  HEMATOLOGIC A: Leukocytosis - Improving. Unclear etiology. Anemia - Likely dilutional. No evidence of active bleeding. Slight worsening today. Thrombocytopenia - Likely dilutional. Mild & stable.  P:  Trending cell counts daily w/ CBC SCDs Heparin [place]  q8hr  INFECTIOUS A: FUO - Likely tracheobronchitis.  Aspiration Pneumonia - S/P Treatment with Vancomycin & Zosyn.   P:     Starting Empiric Cefepime Awaiting finalization Blood & Urine Cultures. Also checking U/A & Tracheal Aspirate Ctx. Trending Procalcitonin per Algorithm   FAMILY  - Updates:  No family bedside 12/5. Wife Jonita updated 12/4 by Dr. Jamison NeighborNestor via phone.   TODAY'S SUMMARY:  [age] y.o. male with severe hypoglycemia and persistent AMS and acute respiratory failure with obvious aspiration. Mental status remains poor. Appears to have tracheobronchitis. Starting Empiric Cefepime. Prognosis is dismal.   I have spent a total of 36 minutes of critical care time today caring for the patient and reviewing the patient's electronic medical record.   Donna ChristenJennings E. Jamison NeighborNestor, M.D. [place] Pager:  [phone] After 3pm or if no response, call [phone]  10:27 AM 11/21/16

## 2016-11-21 NOTE — Care Management Important Message (Signed)
Important Message  Patient Details  Name: Andrew [name] MRN: [id number] Date of Birth: Jan 30, 1956   Medicare Important Message Given:  Other (see comment)    [name] [name] 11/21/2016, 9:34 AM

## 2016-11-21 NOTE — Care Management Note (Signed)
  Case Management Note  Patient Details  Name: Andrew [name] MRN: [id number] Date of Birth: November 11, 1956  Subjective/Objective:     Pt admitted after being found unresponsive for unknown amount of time               Action/Plan:  PTA from home.  Wife recently stayed in SNF.  Per attending prognosis is very poor -  however when he spoke with family (wife and two daughters) at length they were insistent that withdraw is not an option.  Will plan on tracheostomy towards the end of the week and stay as full code.  CM will continue to follow for discharge needs  Expected Discharge Date:                  Expected Discharge Plan:  Skilled Nursing Facility  In-House Referral:  Clinical Social Work  Discharge planning Services  CM Consult  Post Acute Care Choice:    Choice offered to:     DME Arranged:    DME Agency:     HH Arranged:    HH Agency:     Status of Service:  In process, will continue to follow  If discussed at Long Length of Stay Meetings, dates discussed:    Additional Comments: 11/21/2016  CM received call from [facility] liaison - pts daughter/wife contacted facility and requested agency to look over pts case to determine if pt is appropriate for LTACH.  CM informed liaison that pt per attending from last week and this week, in addition to physician advisor has been deemed not appropriate for LTACH due to lack of improvement since admit - pt is not expected to improve and care would be custodial only.  CM followed back up with attending and was again informed [facility].  CM and CSW will continue to follow   Discussed in LOS 11/21/16:  Pt remains appropriate for continued stay and due to prognosis [facility] - discharge plan is for out of state SNF.  CM spoke in detail 11/17/16 with family and explained that due to poor prognosis - pt will need to placed outside of state at a vent SNF.    11/17/16  CM spoke with wife,  daughter and brother in detail at bedside.  Family understands that if pt doesn't wean off ventilator he will need to placed outside of the state.  CM also reviewed the requirements from family for pt to return home with high supplemental oxgyen needs - wife stated she would not be able to care for him in the home . Wife will remain in SNF until 11/29/16.  Current plan is to see how pt does over the weekend to determine appropriate discharge plan.    Discussed during LOS 11/16/16 - pt remains appropriate for continued stay and discharge plan with current trend is SNF.  Pt trached today per family request.  Per attending ; its not likely that pt will wean from ventilator due to AMS - r/t severe hypoglycemia with unknown down time,-EEG c/w severe cerebral injury. -Poor prognosis need family goals of care.  CSW consulted Cherylann ParrClaxton, Eyan Hagood S, RN 11/21/2016, 3:45 PM

## 2016-11-22 ENCOUNTER — Encounter (HOSPITAL_COMMUNITY): Payer: Medicare Other

## 2016-11-22 DIAGNOSIS — J40 Bronchitis, not specified as acute or chronic: Secondary | ICD-10-CM

## 2016-11-22 LAB — GLUCOSE, CAPILLARY
GLUCOSE-CAPILLARY: 241 mg/dL — AB (ref 65–99)
GLUCOSE-CAPILLARY: 255 mg/dL — AB (ref 65–99)
GLUCOSE-CAPILLARY: 273 mg/dL — AB (ref 65–99)
GLUCOSE-CAPILLARY: 280 mg/dL — AB (ref 65–99)
GLUCOSE-CAPILLARY: 283 mg/dL — AB (ref 65–99)
Glucose-Capillary: 316 mg/dL — ABNORMAL HIGH (ref 65–99)
Glucose-Capillary: 336 mg/dL — ABNORMAL HIGH (ref 65–99)

## 2016-11-22 LAB — CBC
HCT: 31.4 % — ABNORMAL LOW (ref 39.0–52.0)
HEMOGLOBIN: 9.8 g/dL — AB (ref 13.0–17.0)
MCH: 28.7 pg (ref 26.0–34.0)
MCHC: 31.2 g/dL (ref 30.0–36.0)
MCV: 92.1 fL (ref 78.0–100.0)
PLATELETS: 155 10*3/uL (ref 150–400)
RBC: 3.41 MIL/uL — AB (ref 4.22–5.81)
RDW: 16.4 % — ABNORMAL HIGH (ref 11.5–15.5)
WBC: 12.4 10*3/uL — AB (ref 4.0–10.5)

## 2016-11-22 LAB — RENAL FUNCTION PANEL
Albumin: 1.6 g/dL — ABNORMAL LOW (ref 3.5–5.0)
Anion gap: 7 (ref 5–15)
BUN: 60 mg/dL — AB (ref 6–20)
CHLORIDE: 124 mmol/L — AB (ref 101–111)
CO2: 18 mmol/L — ABNORMAL LOW (ref 22–32)
Calcium: 7.7 mg/dL — ABNORMAL LOW (ref 8.9–10.3)
Creatinine, Ser: 2.32 mg/dL — ABNORMAL HIGH (ref 0.61–1.24)
GFR calc Af Amer: 34 mL/min — ABNORMAL LOW (ref >60)
GFR, EST NON AFRICAN AMERICAN: 29 mL/min — AB (ref >60)
Glucose, Bld: 335 mg/dL — ABNORMAL HIGH (ref 65–99)
POTASSIUM: 3.6 mmol/L (ref 3.5–5.1)
Phosphorus: 3 mg/dL (ref 2.5–4.6)
Sodium: 149 mmol/L — ABNORMAL HIGH (ref 135–145)

## 2016-11-22 LAB — PROCALCITONIN: PROCALCITONIN: 7.22 ng/mL

## 2016-11-22 LAB — MAGNESIUM: Magnesium: 2.8 mg/dL — ABNORMAL HIGH (ref 1.7–2.4)

## 2016-11-22 MED ORDER — INSULIN NPH (HUMAN) (ISOPHANE) 100 UNIT/ML ~~LOC~~ SUSP
20.0000 [IU] | Freq: Four times a day (QID) | SUBCUTANEOUS | Status: DC
  Administered 2016-11-22 – 2016-11-23 (×4): 20 [IU] via SUBCUTANEOUS
  Filled 2016-11-22: qty 10

## 2016-11-22 MED ORDER — INSULIN ASPART 100 UNIT/ML ~~LOC~~ SOLN
0.0000 [IU] | SUBCUTANEOUS | Status: DC
Start: 2016-11-22 — End: 2016-11-24
  Administered 2016-11-22 (×2): 11 [IU] via SUBCUTANEOUS
  Administered 2016-11-22: 15 [IU] via SUBCUTANEOUS
  Administered 2016-11-22: 11 [IU] via SUBCUTANEOUS
  Administered 2016-11-23: 4 [IU] via SUBCUTANEOUS
  Administered 2016-11-23: 11 [IU] via SUBCUTANEOUS
  Administered 2016-11-23: 15 [IU] via SUBCUTANEOUS
  Administered 2016-11-23 (×2): 20 [IU] via SUBCUTANEOUS
  Administered 2016-11-23: 15 [IU] via SUBCUTANEOUS
  Administered 2016-11-24: 7 [IU] via SUBCUTANEOUS
  Administered 2016-11-24: 11 [IU] via SUBCUTANEOUS
  Administered 2016-11-24: 15 [IU] via SUBCUTANEOUS

## 2016-11-22 NOTE — Progress Notes (Signed)
eLink Physician-Brief Progress Note Patient Name: Sherran NeedsJohnny R Mcphillips DOB: [birth year] MRN: [id number]   Date of Service  11/22/2016  HPI/Events of Note  Request for AM labs. Mg++ level already ordered.   eICU Interventions  Will order: 1. CBC and BMP at 5 AM.     Intervention Category Minor Interventions: Clinical assessment - ordering diagnostic tests  Lenell AntuSommer,Evalisse Prajapati Eugene 11/22/2016, 4:17 AM

## 2016-11-22 NOTE — Progress Notes (Signed)
CSW received message from Sherran NeedsVivian Hanaway (Patient's sister) requesting information regarding possible placement out of state. CSW contacted Patient's daughter and wife and was asked not to discuss discharge plan with anyone outside of wife and daughter at this time as wife is currently trying to decide between comfort measures/terminal extubation vs. Vent SNF out of state. CSW continues to follow for disposition.     Enos FlingAshley Belvin Gauss, MSW, LCSW [facility] ED/91M Clinical Social Worker [phone]

## 2016-11-22 NOTE — Progress Notes (Signed)
CSW attended goals of care meeting today with Physician, Bedside RN, Patient's wife, and Patient's wife's sister. CSW provided Patient's family with list of Vent SNFs including Vent SNFs in [place], [place], and [place] as Vent SNF placement in [place] is very limited. MD spoke at length with patient's wife at bedside. Explained his dismal prognosis for recovery. Patient's wife reports that spoke with Patient's father and they both feel he would not wish to be maintained in his current state long term. MD discussed the fact that Patient has brain damage that he is unlikely to recover from and the odds grow ever more slight by the day. We also discussed his ongoing medical issues and the certainty that more will arise given his dependent state. Patient's is going to confer with other family members before making a decision on his plan of care on Friday. CSW will meet again with family on Friday regarding disposition.      Enos FlingAshley Donnelle Olmeda, MSW, LCSW [facility]/36M Clinical Social Worker [phone]

## 2016-11-22 NOTE — Significant Event (Signed)
Asked by patient's primary RN Stark JockIrekia to troubleshoot cortrak feeding tube occlusion. RN stated it had came out 2cm earlier and she attempted to reinsert it and now tube cannot be flush with fluid or air. This RN traced the tube and adjusted the kink using existing tube stylus and the cortrak machine. Able to flush tube now. Feeding tubes remain post pyloric, marked at 95cm at the nare.    Andrew [name]

## 2016-11-22 NOTE — Progress Notes (Signed)
Inpatient Diabetes Program Recommendations  AACE/ADA: New Consensus Statement on Inpatient Glycemic Control (2015)  Target Ranges:  Prepandial:   less than 140 mg/dL      Peak postprandial:   less than 180 mg/dL (1-2 hours)      Critically ill patients:  140 - 180 mg/dL   Lab Results  Component Value Date   GLUCAP 283 (H) 11/22/2016   HGBA1C 7.9 10/09/2016    Review of Glycemic Control  Needs insulin adjustment.  Inpatient Diabetes Program Recommendations:    Increase NPH to 30 units Q12H. Preferred insulin - Lantus. Pt's home med Add TF coverage - 4 units Q4H.  Continue to follow.  Thank you. Ailene Ardshonda Hether Anselmo, RD, LDN, CDE Inpatient Diabetes Coordinator [phone]

## 2016-11-22 NOTE — Progress Notes (Signed)
PULMONARY / CRITICAL CARE MEDICINE   Name: Andrew [name] MRN: [id number] DOB: 18-Feb-1956    ADMISSION DATE:  11/08/2016  REFERRING MD:  EDP   CHIEF COMPLAINT:  AMS, respiratory failure, aspiration   Brief:  60 y.o. male with hx IDDM, GERD, HTN, OSA presented 11/22 after being found unresponsive at home for unknown period of time.  He lives with his wife but she has been in SNF for the last month.  Was last seen normal 2 days prior although family is trying to determine if his home health aide saw him in the interim.  Was found to have glucose 21, no improvement in mental status with D50.  He was bagged by EMS en route and had multiple episodes of vomiting with obvious aspiration.  Intubated on arrival to ER and PCCM called to admit.  Of note, daughter concerned that he has been very depressed the last few weeks.  There were family pictures strewn about the house when family arrived.  He has had little to no PO intake but continues to take his insulin per family.      SUBJECTIVE:  No acute events overnight. Febrile again this morning.   REVIEW OF SYSTEMS:  Unable to obtain with patient intubated.   VITAL SIGNS: BP 119/69 (BP Location: Left Arm)   Pulse (!) 108   Temp 100.2 F (37.9 C) (Oral)   Resp (!) 26   Ht 6' (1.829 m)   Wt 224 lb 10.4 oz (101.9 kg)   SpO2 97%   BMI 30.47 kg/m   HEMODYNAMICS:    VENTILATOR SETTINGS: Vent Mode: PRVC FiO2 (%):  [30 %] 30 % Set Rate:  [18 bmp] 18 bmp Vt Set:  [409[620 mL] 620 mL PEEP:  [5 cmH20] 5 cmH20 Plateau Pressure:  [14 cmH20-20 cmH20] 14 cmH20  INTAKE / OUTPUT: I/O last 3 completed shifts: In: 2970 [I.V.:740; NG/GT:2180; IV Piggyback:50] Out: 4475 [Urine:4025; Stool:450]  PHYSICAL EXAMINATION: General:  No distress. Eyes closed. No family present.  Neuro:  Doesn't follow commands. Doesn't attend to voice. Triple flexion bilateral lower extremities.  HEENT:  ETT in place. No scleral icterus or injection. Opaque  corneas. Cardiovascular:  Regular rhythm. Mild upper extremity edema. No appreciable JVD. Pulmonary:  Symmetric chest rise on ventilator. Coarse breath sounds bilaterally. Abdomen:  Soft. Protuberant. Normoactive bowel sounds. Integument:  Warm & dry. No rash on exposed skin.   LABS:  BMET  Recent Labs Lab 11/20/16 0430 11/21/16 0340 11/22/16 0432  NA 152* 149* 149*  K 3.5 3.4* 3.6  CL 124* 122* 124*  CO2 19* 21* 18*  BUN 63* 59* 60*  CREATININE 2.33* 2.43* 2.32*  GLUCOSE 218* 259* 335*   Electrolytes  Recent Labs Lab 11/20/16 0430 11/21/16 0340 11/22/16 0432  CALCIUM 7.9* 7.7* 7.7*  MG 2.5* 2.4 2.8*  PHOS 3.3 3.2 3.0   CBC  Recent Labs Lab 11/20/16 0430 11/21/16 0340 11/22/16 0433  WBC 16.7* 15.6* 12.4*  HGB 10.8* 9.9* 9.8*  HCT 35.6* 32.0* 31.4*  PLT 142* 146* 155   Coag's  Recent Labs Lab 11/17/16 0500 11/21/16 0340  INR 1.24 1.32   Sepsis Markers  Recent Labs Lab 11/20/16 1432 11/21/16 0340 11/22/16 0432  PROCALCITON 2.28 3.90 7.22    ABG  Recent Labs Lab 11/18/16 0425  PHART 7.530*  PCO2ART 25.1*  PO2ART 92.4   Liver Enzymes  Recent Labs Lab 11/21/16 0340 11/22/16 0432  AST 86*  --   ALT 162*  --  ALKPHOS 203*  --   BILITOT 1.0  --   ALBUMIN 1.8*  1.7* 1.6*    Cardiac Enzymes No results for input(s): TROPONINI, PROBNP in the last 168 hours.  Glucose  Recent Labs Lab 11/21/16 1141 11/21/16 1628 11/21/16 1954 11/22/16 0004 11/22/16 0400 11/22/16 0735  GLUCAP 252* 299* 330* 316* 283* 241*    Imaging   STUDIES:  EEG 11/22: severe diffuse dysfxn CT Head 11/22:  Nothing acute.  MRI Brain 11/27: Mild atrophy and white matter disease is slightly advanced for age. This likely reflects the sequela of chronic microvascular ischemia  MICROBIOLOGY: MRSA PCR 11/22:  Negative Urine Ctx 11/22:  Negative Blood Ctx x2 11/22:  Negative Blood Ctx x2 11/29:  1/2 Coag Negative Staph  Blood Ctx x2 11/30:  Negative   Blood Ctx x2 12/4 >> Tracheal Asp Ctx 12/4 >> Klebsiella pneumoniae  Urine Ctx 12/4:  Negative   ANTIBIOTICS: Zosyn 11/22 - 11/27 Vancomycin 11/30 - 12/3 Cefepime 12/5 >>  SIGNIFICANT EVENTS: 11/22 - admit after being found unresponsive. Hypoglycemia proloned down time.   LINES/TUBES: ETT 11/22 - 12/1 Trach #6 [place]) 12/1 >>> RUE TL PICC 11/23 >>> FOLEY >>> L NGT 12/2 >>>  ASSESSMENT / PLAN:  ENDOCRINE A: Severe Hypoglycemia - Resolved w/ D5W. Now hyperglycemic. H/O IDDM  P:   SSI per Resistant algorithm Accu-Checks q4hr Change to NPH 20u [place] q6hr  NEUROLOGIC A: Acute Encephalopathy - Likely due to severe hypoglycemia w/ unknown downtime. Last seen normal 2 days prior to admit. Anoxic Brain Injury  Sedation on Ventilator   P:   Desired RASS:  0 Neurology Previously Consulted - Now signed off. Last seen 11/29. Fentanyl IV prn Pain Holding sedation   PULMONARY A: Acute Hypoxic Respiratory Failure - Not tolerating wean. Copious Tracheal Secretions H/O OSA  P:   Full Vent Support Intermittent ABG & Port CXR Holding on SBT until mental status improves Continuing PS wean  CARDIOVASCULAR A: Elevated Troponin I - Likely demand ischemia.  Sinus Tachycardia H/O HTN H/O Hyperlipidemia   P:  Continuous telemetry monitoring Vitals per unit protocol Lopressor 12.5mg  VT bid Holding home Cozaar Goal MAP >65  RENAL A: Acute Renal Failure - Improving. Severe Rhabdomyolysis - Improving. Hypernatremia - Stagble. Hyperchloremia - Stable. Hypokalemia - Resolved. NAGMA - Secondary to hyperchloremia.   P:   Monitoring UOP Trending electrolytes & renal function daily Replacing electrolytes as indicated Continuing Free Water 400cc q4hr VT  GASTROINTESTINAL A: Transaminitis - Improving. Likely due to hypoglycemia. H/O GERD  P:   NPO Continuing Tube Feedings PEG tube by IR on hold Zantac 150mg  qhs  HEMATOLOGIC A: Leukocytosis - Improving. Unclear  etiology. Anemia - Likely dilutional. No evidence of active bleeding. Slight worsening today. Thrombocytopenia - Likely dilutional. Improving.  P:  Trending cell counts daily w/ CBC SCDs Heparin [place] q8hr  INFECTIOUS A: FUO - Likely tracheobronchitis.  Aspiration Pneumonia - S/P Treatment with Vancomycin & Zosyn.   P:     Empiric Cefepime Day #2 Awaiting finalization Blood & Tracheal Aspirate Ctx. Trending Procalcitonin per Algorithm   FAMILY   - Updates:  No family bedside 12/6. Daughter updated via phone 12/5 by Dr. Jamison Neighbor.  TODAY'S SUMMARY:  60 y.o. male with severe hypoglycemia and persistent AMS and acute respiratory failure with obvious aspiration. Still no improvement in mental status. Continuing empiric Cefepime for tracheobronchitis. Planned for family discussion today to address our goals of care.   I have spent a total of 32 minutes of  critical care time today caring for the patient and reviewing the patient's electronic medical record.   Donna ChristenJennings E. Jamison NeighborNestor, M.D. [facility] Pager:  [phone] After 3pm or if no response, call [phone] 10:32 AM 11/22/16

## 2016-11-22 NOTE — Plan of Care (Signed)
  Interdisciplinary Goals of Care Family Meeting   Date carried out:: 11/22/2016  Location of the meeting: Bedside  Member's involved: Physician, Bedside Registered Nurse, Social Worker and Family Member or next of kin  Durable Power of Attorney or acting medical decision maker: Wife  [name].  Discussion: We discussed goals of care for Andrew NeedsJohnny R [name] .  Spoke at length with patient's wife at bedside. Explained his dismal prognosis for recovery. She spoke with his father and they both feel he would not wish to be maintained in his current state long term. We discussed the fact that he has brain damage that he is unlikely to recover from and the odds grow ever more slight by the day. We also discussed his ongoing medical issues and the certainty that more will arise given his dependent state. She is going to confer with other family members before making a decision on his plan of care on Friday.   Code status: Full Code  Disposition: Continue current acute care  Time spent for the meeting: 27 minutes   [name] 11/22/2016, 12:51 PM

## 2016-11-23 DIAGNOSIS — Z930 Tracheostomy status: Secondary | ICD-10-CM

## 2016-11-23 DIAGNOSIS — R739 Hyperglycemia, unspecified: Secondary | ICD-10-CM

## 2016-11-23 LAB — RENAL FUNCTION PANEL
Albumin: 1.6 g/dL — ABNORMAL LOW (ref 3.5–5.0)
Anion gap: 9 (ref 5–15)
BUN: 64 mg/dL — ABNORMAL HIGH (ref 6–20)
CO2: 16 mmol/L — ABNORMAL LOW (ref 22–32)
Calcium: 7.8 mg/dL — ABNORMAL LOW (ref 8.9–10.3)
Chloride: 127 mmol/L — ABNORMAL HIGH (ref 101–111)
Creatinine, Ser: 2.3 mg/dL — ABNORMAL HIGH (ref 0.61–1.24)
GFR calc Af Amer: 34 mL/min — ABNORMAL LOW (ref >60)
GFR calc non Af Amer: 29 mL/min — ABNORMAL LOW (ref >60)
Glucose, Bld: 297 mg/dL — ABNORMAL HIGH (ref 65–99)
Phosphorus: 3.3 mg/dL (ref 2.5–4.6)
Potassium: 3.5 mmol/L (ref 3.5–5.1)
Sodium: 152 mmol/L — ABNORMAL HIGH (ref 135–145)

## 2016-11-23 LAB — CBC WITH DIFFERENTIAL/PLATELET
BASOS ABS: 0 10*3/uL (ref 0.0–0.1)
BASOS PCT: 0 %
EOS PCT: 1 %
Eosinophils Absolute: 0.2 10*3/uL (ref 0.0–0.7)
HCT: 33 % — ABNORMAL LOW (ref 39.0–52.0)
Hemoglobin: 10.3 g/dL — ABNORMAL LOW (ref 13.0–17.0)
Lymphocytes Relative: 14 %
Lymphs Abs: 1.8 10*3/uL (ref 0.7–4.0)
MCH: 28.7 pg (ref 26.0–34.0)
MCHC: 31.2 g/dL (ref 30.0–36.0)
MCV: 91.9 fL (ref 78.0–100.0)
MONO ABS: 1.2 10*3/uL — AB (ref 0.1–1.0)
MONOS PCT: 10 %
Neutro Abs: 9.2 10*3/uL — ABNORMAL HIGH (ref 1.7–7.7)
Neutrophils Relative %: 74 %
PLATELETS: 172 10*3/uL (ref 150–400)
RBC: 3.59 MIL/uL — ABNORMAL LOW (ref 4.22–5.81)
RDW: 16.5 % — AB (ref 11.5–15.5)
WBC: 12.4 10*3/uL — ABNORMAL HIGH (ref 4.0–10.5)

## 2016-11-23 LAB — GLUCOSE, CAPILLARY
GLUCOSE-CAPILLARY: 331 mg/dL — AB (ref 65–99)
GLUCOSE-CAPILLARY: 360 mg/dL — AB (ref 65–99)
Glucose-Capillary: 259 mg/dL — ABNORMAL HIGH (ref 65–99)
Glucose-Capillary: 185 mg/dL — ABNORMAL HIGH (ref 65–99)
Glucose-Capillary: 356 mg/dL — ABNORMAL HIGH (ref 65–99)
Glucose-Capillary: 315 mg/dL — ABNORMAL HIGH (ref 65–99)

## 2016-11-23 LAB — MAGNESIUM: Magnesium: 2.8 mg/dL — ABNORMAL HIGH (ref 1.7–2.4)

## 2016-11-23 LAB — CULTURE, RESPIRATORY

## 2016-11-23 LAB — CULTURE, RESPIRATORY W GRAM STAIN: Special Requests: NORMAL

## 2016-11-23 MED ORDER — CLONAZEPAM 0.5 MG PO TBDP
1.0000 mg | ORAL_TABLET | Freq: Two times a day (BID) | ORAL | Status: DC
  Filled 2016-11-23: qty 2

## 2016-11-23 MED ORDER — MIDAZOLAM HCL 2 MG/2ML IJ SOLN
INTRAMUSCULAR | Status: AC
  Administered 2016-11-23: 1 mg
  Filled 2016-11-23: qty 2

## 2016-11-23 MED ORDER — DEXTROSE 5 % IV SOLN
1.0000 g | INTRAVENOUS | Status: DC
  Administered 2016-11-23 – 2016-11-24 (×2): 1 g via INTRAVENOUS
  Filled 2016-11-23 (×2): qty 10

## 2016-11-23 MED ORDER — MIDAZOLAM HCL 2 MG/2ML IJ SOLN
INTRAMUSCULAR | Status: AC
Start: 2016-11-23 — End: 2016-11-23
  Administered 2016-11-23: 2 mg
  Filled 2016-11-23: qty 2

## 2016-11-23 MED ORDER — INSULIN NPH (HUMAN) (ISOPHANE) 100 UNIT/ML ~~LOC~~ SUSP
30.0000 [IU] | Freq: Four times a day (QID) | SUBCUTANEOUS | Status: DC
  Administered 2016-11-23 – 2016-11-24 (×5): 30 [IU] via SUBCUTANEOUS
  Filled 2016-11-23: qty 10

## 2016-11-23 MED ORDER — POTASSIUM CHLORIDE 20 MEQ/15ML (10%) PO SOLN
40.0000 meq | Freq: Once | ORAL | Status: AC
  Administered 2016-11-23: 40 meq
  Filled 2016-11-23: qty 30

## 2016-11-23 MED ORDER — MIDAZOLAM HCL 2 MG/2ML IJ SOLN: 1.0000 mg | Freq: Once | INTRAMUSCULAR | Status: AC

## 2016-11-23 MED ORDER — DEXTROSE 5 % IV SOLN
INTRAVENOUS | Status: DC
  Administered 2016-11-23: 100 mL via INTRAVENOUS
  Administered 2016-11-23: 10:00:00 via INTRAVENOUS
  Administered 2016-11-24: 100 mL via INTRAVENOUS

## 2016-11-23 MED ORDER — CLONAZEPAM 0.1 MG/ML ORAL SUSPENSION
1.0000 mg | Freq: Two times a day (BID) | ORAL | Status: DC
  Filled 2016-11-23: qty 10

## 2016-11-23 NOTE — Progress Notes (Signed)
Pts Na rising, fluids changed to d5 at 100. CBG now 300's. NPH administered as ordered and sliding scale as ordered. Nursing to continue to monitor pt.

## 2016-11-23 NOTE — Progress Notes (Addendum)
Nutrition Follow-up  DOCUMENTATION CODES:   Obesity unspecified  INTERVENTION:    Continue TF via OGT with Vital High Proteinat goal rate of 20ml/h (480ml per day) and Prostat 60ml QIDto provide 1280kcals, 162gm protein, 401ml free water daily.  NUTRITION DIAGNOSIS:   Inadequate oral intake related to inability to eat as evidenced by NPO status.  Ongoing  GOAL:   Provide needs based on ASPEN/SCCM guidelines  Met  MONITOR:   Vent status, Labs, I & O's  ASSESSMENT:   60 yo male admitted on 11/22 with severe hypoglycemia, persistent AMS, and acute respiratory failure with obvious aspiration.   Discussed patient in ICU rounds and with RN today. S/P tracheostomy placement on 12/1. Required tracheostomy change today with improved cuff leak. Plans for family meeting Friday to decide on PEG placement, palliative care, LTACH placement.  Patient is currently receiving Vital High Proteinat 20ml/h (480ml per day) and Prostat 60ml QIDto provide 1280kcals, 162gm protein, 401ml free water daily. Free water flushes 400 ml every 4 hours. Patient is currently intubated on ventilator support Temp (24hrs), Avg:101.3 F (38.5 C), Min:99.5 F (37.5 C), Max:103 F (39.4 C)   Diet Order:  Diet NPO time specified  Skin:  Reviewed, no issues  Last BM:  11/22  Height:   Ht Readings from Last 1 Encounters:  11/08/16 6' (1.829 m)    Weight:   Wt Readings from Last 1 Encounters:  11/23/16 222 lb 3.6 oz (100.8 kg)    Ideal Body Weight:  80.9 kg  BMI:  Body mass index is 30.14 kg/m.  Estimated Nutritional Needs:   Kcal:  1236-1574  Protein:  162 gm  Fluid:  2 L  EDUCATION NEEDS:   No education needs identified at this time  Kimberly Harris, RD, LDN, CNSC Pager [phone] After Hours Pager [phone]  

## 2016-11-23 NOTE — Progress Notes (Signed)
RT note- Patient has a large cuff leak, extra long distal at bedside for change if needed. No wean at this time due to increased HR and respirations.

## 2016-11-23 NOTE — Procedures (Signed)
Tracheostomy tube change: Informed verbal consent was due to emergent situation.  Verbal timeout was performed prior to the procedure. A flexible bronchoscope was passed thru the older trach to verify that the trach remained still in the airway. The old  #6 cuffed trach was carefully removed. the tracheostomy site appeared: unremarkbale. A new #  6 prox xlt Cuffed trach was easily placed in the tracheostomy stoma and secured with velcro trach ties. The tracheostomy was patent, good color change observed via EZ-CAP, and then airway was assessed via bedside bronchoscopy. Pt tolerated well. RR remained elevated (suspect neuro-driven) but cuff leak resolved.   Simonne MartinetPeter E Alecxis Baltzell ACNP-BC [facility] Pager # [phone] OR # [phone] if no answer

## 2016-11-23 NOTE — Care Management Note (Addendum)
Case Management Note  Patient Details  Name: Sherran NeedsJohnny R General MRN: [id number] Date of Birth: 1956-09-22  Subjective/Objective:     Pt admitted after being found unresponsive for unknown amount of time               Action/Plan:  PTA from home.  Wife recently stayed in SNF.  Per attending prognosis is very poor -  however when he spoke with family (wife and two daughters) at length they were insistent that withdraw is not an option.  Will plan on tracheostomy towards the end of the week and stay as full code.  CM will continue to follow for discharge needs  Expected Discharge Date:                  Expected Discharge Plan:  Skilled Nursing Facility  In-House Referral:  Clinical Social Work  Discharge planning Services  CM Consult  Post Acute Care Choice:    Choice offered to:     DME Arranged:    DME Agency:     HH Arranged:    HH Agency:     Status of Service:  In process, will continue to follow  If discussed at Long Length of Stay Meetings, dates discussed:    Additional Comments: 11/23/2016  Daughter toured facility and now at bedside with wife on speaker; they both agree for pt to discharge to [facility] tomorrow.  CM communicated this with bedside nurse and [facility] liason  Pts wife contacted CM; CM explained in detail that pt [facility] to allow family more time to finalize goals of care.  CM has also explained to wife the LTAC has been determined to be a safe/appropriate dispositon. Wife stated at this time they are unsure if they will like pt to discharge to facility - stated bad reviews as the barrier. CM explained that [facility] is the only [facility] [place] willing to offer pt a bed and that bed offer is only good for 48 hours (per facility).  Daughter will tour [facility] at 2pm today and per wife - the decision will be made at that point regarding possible discharge to facility/goals of care and wife will follow up with Cm  Unfortunately  [facility] will not be able to offer pt a bed.  CM attempted to contact family to inform but was unsuccessful.  Discussed in LOS 11/23/16 - pt remains appropriate for continued stay.  Physician Advisor [facility] on the basis of an extention of palliative support - family still struggling with terminal extubation and continued care.  Both agencies received and accepted referral during LOS.  Per pts chart - daughter needs to be consulted due to wife being in facility.   CM spoke with daughter Marcelle Smilingatasha and explained why pt is now eligible for LTACH benefit and CM offered pt choice between [facility] and [facility].  CM reiterated that [facility] does not change pts prognosis and will simply provide more time for family finalized goals of care. Daughter stated she wanted to call mom and explain and provide the choice - daughter to follow back up with CM.     11/21/16 CM received call from [facility] liaison - pts daughter/wife contacted facility and requested agency to look over pts case to determine if pt is appropriate for LTACH.  CM informed liaison that pt per attending from last week and this week, in addition to physician advisor has been deemed not  appropriate for LTACH due to lack of improvement since admit - pt is not expected to improve and care would be custodial only.  CM followed back up with attending and was again informed [facility].  CM and CSW will continue to follow   Discussed in LOS 11/21/16:  Pt remains appropriate for continued stay and due to prognosis is not deemed [facility] - discharge plan is for out of state SNF.  CM spoke in detail 11/17/16 with family and explained that due to poor prognosis - pt will need to placed outside of state at a vent SNF.    11/17/16  CM spoke with wife, daughter and brother in detail at bedside.  Family understands that if pt doesn't wean off ventilator he will need to placed outside of the state.   CM also reviewed the requirements from family for pt to return home with high supplemental oxgyen needs - wife stated she would not be able to care for him in the home . Wife will remain in SNF until 11/29/16.  Current plan is to see how pt does over the weekend to determine appropriate discharge plan.    Discussed during LOS 11/16/16 - pt remains appropriate for continued stay and discharge plan with current trend is SNF.  Pt trached today per family request.  Per attending ; its not likely that pt will wean from ventilator due to AMS - r/t severe hypoglycemia with unknown down time,-EEG c/w severe cerebral injury. -Poor prognosis need family goals of care.  CSW consulted Cherylann ParrClaxton, Jabier Deese S, RN 11/23/2016, 9:45 AM

## 2016-11-23 NOTE — Progress Notes (Signed)
Pt continues to have large cuff leak throughout the night. MD made aware earlier in shift. RT will continue to monitor.

## 2016-11-23 NOTE — Progress Notes (Signed)
PULMONARY / CRITICAL CARE MEDICINE   Name: Andrew NeedsJohnny R [name] MRN: [id number] DOB: August 03, 1956    ADMISSION DATE:  11/08/2016  REFERRING MD:  EDP   CHIEF COMPLAINT:  AMS, respiratory failure, aspiration   Brief:  60 y.o. male with hx IDDM, GERD, HTN, OSA presented 11/22 after being found unresponsive at home for unknown period of time.  He lives with his wife but she has been in SNF for the last month.  Was last seen normal 2 days prior although family is trying to determine if his home health aide saw him in the interim.  Was found to have glucose 21, no improvement in mental status with D50.  He was bagged by EMS en route and had multiple episodes of vomiting with obvious aspiration.  Intubated on arrival to ER and PCCM called to admit.  Of note, daughter concerned that he has been very depressed the last few weeks.  There were family pictures strewn about the house when family arrived.  He has had little to no PO intake but continues to take his insulin per family.      SUBJECTIVE:  No acute events overnight. . Again febrile   REVIEW OF SYSTEMS:  Unable to obtain with patient intubated.   VITAL SIGNS: BP (!) 140/57   Pulse (!) 124   Temp (!) 102.6 F (39.2 C) (Oral)   Resp (!) 33   Ht 6' (1.829 m)   Wt 222 lb 3.6 oz (100.8 kg)   SpO2 95%   BMI 30.14 kg/m   HEMODYNAMICS:    VENTILATOR SETTINGS: Vent Mode: PRVC FiO2 (%):  [30 %] 30 % Set Rate:  [18 bmp] 18 bmp Vt Set:  [620 mL] 620 mL PEEP:  [5 cmH20] 5 cmH20 Pressure Support:  [12 cmH20] 12 cmH20 Plateau Pressure:  [19 cmH20-22 cmH20] 20 cmH20  INTAKE / OUTPUT: I/O last 3 completed shifts: In: 3670 [I.V.:740; NG/GT:2880; IV Piggyback:50] Out: 4000 [Urine:3475; Stool:525]  PHYSICAL EXAMINATION: General:  No distress. Eyes closed. No family present.  Neuro:  Doesn't follow commands. Doesn't attend to voice. Triple flexion bilateral lower extremities. Unchanged  HEENT:  trach in place. No scleral icterus or injection.  Opaque corneas. Cardiovascular:  Regular rhythm. Mild upper extremity edema. No appreciable JVD. Pulmonary:  Symmetric chest rise on ventilator. Coarse breath sounds bilaterally/ scattered rhonchi Abdomen:  Soft. Protuberant. Normoactive bowel sounds. Integument:  Warm & dry. No rash on exposed skin.   LABS:  BMET  Recent Labs Lab 11/21/16 0340 11/22/16 0432 11/23/16 0458  NA 149* 149* 152*  K 3.4* 3.6 3.5  CL 122* 124* 127*  CO2 21* 18* 16*  BUN 59* 60* 64*  CREATININE 2.43* 2.32* 2.30*  GLUCOSE 259* 335* 297*   Electrolytes  Recent Labs Lab 11/21/16 0340 11/22/16 0432 11/23/16 0458  CALCIUM 7.7* 7.7* 7.8*  MG 2.4 2.8* 2.8*  PHOS 3.2 3.0 3.3   CBC  Recent Labs Lab 11/21/16 0340 11/22/16 0433 11/23/16 0458  WBC 15.6* 12.4* 12.4*  HGB 9.9* 9.8* 10.3*  HCT 32.0* 31.4* 33.0*  PLT 146* 155 172   Coag's  Recent Labs Lab 11/17/16 0500 11/21/16 0340  INR 1.24 1.32   Sepsis Markers  Recent Labs Lab 11/20/16 1432 11/21/16 0340 11/22/16 0432  PROCALCITON 2.28 3.90 7.22    ABG  Recent Labs Lab 11/18/16 0425  PHART 7.530*  PCO2ART 25.1*  PO2ART 92.4   Liver Enzymes  Recent Labs Lab 11/21/16 0340 11/22/16 0432 11/23/16 0458  AST 86*  --   --  ALT 162*  --   --   ALKPHOS 203*  --   --   BILITOT 1.0  --   --   ALBUMIN 1.8*  1.7* 1.6* 1.6*    Cardiac Enzymes No results for input(s): TROPONINI, PROBNP in the last 168 hours.  Glucose  Recent Labs Lab 11/22/16 1129 11/22/16 1541 11/22/16 1943 11/22/16 2334 11/23/16 0324 11/23/16 0809  GLUCAP 336* 280* 273* 255* 259* 185*    Imaging   STUDIES:  EEG 11/22: severe diffuse dysfxn CT Head 11/22:  Nothing acute.  MRI Brain 11/27: Mild atrophy and white matter disease is slightly advanced for age. This likely reflects the sequela of chronic microvascular ischemia  MICROBIOLOGY: MRSA PCR 11/22:  Negative Urine Ctx 11/22:  Negative Blood Ctx x2 11/22:  Negative Blood Ctx x2  11/29:  1/2 Coag Negative Staph  Blood Ctx x2 11/30:  Negative  Blood Ctx x2 12/4 >> Tracheal Asp Ctx 12/4 >> Klebsiella pneumoniae  Urine Ctx 12/4:  Negative   ANTIBIOTICS: Zosyn 11/22 - 11/27 Vancomycin 11/30 - 12/3 Cefepime 12/5 >>12/7 Ceftriaxone 12/7>>>  SIGNIFICANT EVENTS: 11/22 - admit after being found unresponsive. Hypoglycemia proloned down time.   LINES/TUBES: ETT 11/22 - 12/1 Trach #6 Ninetta Lights(JY) 12/1 >>> RUE TL PICC 11/23 >>> FOLEY >>> L NGT 12/2 >>>  ASSESSMENT / PLAN:  ENDOCRINE A: Severe Hypoglycemia - Resolved w/ D5W. Now hyperglycemic. H/O IDDM  P:   SSI per Resistant algorithm Accu-Checks q4hr Change to NPH 30 u [place] q6hr  NEUROLOGIC A: Acute Encephalopathy - Likely due to severe hypoglycemia w/ unknown downtime. Last seen normal 2 days prior to admit. Anoxic Brain Injury  Sedation on Ventilator   P:   Desired RASS:  0 Neurology Previously Consulted - Now signed off. Last seen 11/29. Fentanyl IV prn Pain Holding sedation   PULMONARY A: Acute Hypoxic Respiratory Failure - Not tolerating wean. Copious Tracheal Secretions Tachypnea -->think neuro related.  Cuff leak H/O OSA  P:   Full Vent Support Change trach to 6 prox xlt Intermittent ABG & Port CXR Holding on SBT until mental status improves Continuing PS wean  CARDIOVASCULAR A: Elevated Troponin I - Likely demand ischemia.  Sinus Tachycardia H/O HTN H/O Hyperlipidemia   P:  Continuous telemetry monitoring Vitals per unit protocol Lopressor 12.5mg  VT bid Holding home Cozaar Goal MAP >65  RENAL A: Acute Renal Failure - Improving. Severe Rhabdomyolysis - Improving. Hypernatremia - w/ Na rising  Hyperchloremia - w/ Cl rising  Hypokalemia - Resolved. NAGMA - Secondary to hyperchloremia.   P:   Monitoring UOP Trending electrolytes & renal function daily Replacing electrolytes as indicated Continuing Free Water 400cc q4hr VT  GASTROINTESTINAL A: Transaminitis - Improving.  Likely due to hypoglycemia. H/O GERD  P:   NPO Continuing Tube Feedings PEG tube by IR on hold Zantac 150mg  qhs  HEMATOLOGIC A: Leukocytosis - Improving. Unclear etiology. Anemia - Likely dilutional. No evidence of active bleeding. Slight worsening today. Thrombocytopenia - Likely dilutional. Improving.  P:  Trending cell counts daily w/ CBC SCDs Heparin [place] q8hr  INFECTIOUS A:  Aspiration Pneumonia - (sputum + for Klebsiella)-->pansenstive    P:     Empiric Cefepime Day #2; will change to CTX  Trending Procalcitonin per Algorithm   FAMILY   - Updates:  No family bedside 12/6. Daughter updated via phone 12/5 by Dr. Jamison NeighborNestor.  TODAY'S SUMMARY:  60 y.o. male with severe hypoglycemia and persistent AMS and acute respiratory failure with obvious aspiration. Still no  improvement in mental status. Continuing empiric Cefepime for tracheobronchitis. Correcting water deficits. No improvement. Planning on LTAC xfer possibly for palliative support.   y CCM tie 20 minutes,.   Simonne Martinet ACNP-BC [facility] Pulmonary/Critical Care Pager # [phone] OR # [phone] if no answer

## 2016-11-24 LAB — GLUCOSE, CAPILLARY
GLUCOSE-CAPILLARY: 279 mg/dL — AB (ref 65–99)
Glucose-Capillary: 214 mg/dL — ABNORMAL HIGH (ref 65–99)
Glucose-Capillary: 306 mg/dL — ABNORMAL HIGH (ref 65–99)

## 2016-11-24 LAB — CULTURE, BLOOD (ROUTINE X 2)

## 2016-11-24 LAB — CBC WITH DIFFERENTIAL/PLATELET
BASOS PCT: 0 %
Basophils Absolute: 0 10*3/uL (ref 0.0–0.1)
EOS ABS: 0.3 10*3/uL (ref 0.0–0.7)
Eosinophils Relative: 2 %
HCT: 29.4 % — ABNORMAL LOW (ref 39.0–52.0)
HEMOGLOBIN: 9.3 g/dL — AB (ref 13.0–17.0)
Lymphocytes Relative: 19 %
Lymphs Abs: 2.4 10*3/uL (ref 0.7–4.0)
MCH: 28.6 pg (ref 26.0–34.0)
MCHC: 31.6 g/dL (ref 30.0–36.0)
MCV: 90.5 fL (ref 78.0–100.0)
MONO ABS: 1.4 10*3/uL — AB (ref 0.1–1.0)
MONOS PCT: 11 %
NEUTROS PCT: 67 %
Neutro Abs: 8.5 10*3/uL — ABNORMAL HIGH (ref 1.7–7.7)
Platelets: 182 10*3/uL (ref 150–400)
RBC: 3.25 MIL/uL — ABNORMAL LOW (ref 4.22–5.81)
RDW: 16.7 % — AB (ref 11.5–15.5)
WBC: 12.7 10*3/uL — ABNORMAL HIGH (ref 4.0–10.5)

## 2016-11-24 LAB — RENAL FUNCTION PANEL
ALBUMIN: 1.6 g/dL — AB (ref 3.5–5.0)
Anion gap: 8 (ref 5–15)
BUN: 68 mg/dL — ABNORMAL HIGH (ref 6–20)
CALCIUM: 7.6 mg/dL — AB (ref 8.9–10.3)
CO2: 16 mmol/L — AB (ref 22–32)
CREATININE: 2.35 mg/dL — AB (ref 0.61–1.24)
Chloride: 125 mmol/L — ABNORMAL HIGH (ref 101–111)
GFR calc non Af Amer: 29 mL/min — ABNORMAL LOW (ref >60)
GFR, EST AFRICAN AMERICAN: 33 mL/min — AB (ref >60)
Glucose, Bld: 320 mg/dL — ABNORMAL HIGH (ref 65–99)
Phosphorus: 2.9 mg/dL (ref 2.5–4.6)
Potassium: 4.3 mmol/L (ref 3.5–5.1)
SODIUM: 149 mmol/L — AB (ref 135–145)

## 2016-11-24 LAB — MAGNESIUM: MAGNESIUM: 3 mg/dL — AB (ref 1.7–2.4)

## 2016-11-24 MED ORDER — METOPROLOL TARTRATE 5 MG/5ML IV SOLN: 5.0000 mg | INTRAVENOUS | Status: AC | PRN

## 2016-11-24 MED ORDER — PRO-STAT SUGAR FREE PO LIQD: 60.0000 mL | Freq: Four times a day (QID) | ORAL | 0 refills | Status: AC

## 2016-11-24 MED ORDER — DEXTROSE 5 % IV SOLN: INTRAVENOUS | Status: AC

## 2016-11-24 MED ORDER — HEPARIN SODIUM (PORCINE) 5000 UNIT/ML IJ SOLN: 5000.0000 [IU] | Freq: Three times a day (TID) | INTRAMUSCULAR | Status: AC

## 2016-11-24 MED ORDER — ALBUTEROL SULFATE (2.5 MG/3ML) 0.083% IN NEBU: 2.5000 mg | INHALATION_SOLUTION | RESPIRATORY_TRACT | 12 refills | Status: AC | PRN

## 2016-11-24 MED ORDER — VITAL HIGH PROTEIN PO LIQD: 1000.0000 mL | ORAL | Status: AC

## 2016-11-24 MED ORDER — CHLORHEXIDINE GLUCONATE 0.12% ORAL RINSE (MEDLINE KIT): 15.0000 mL | Freq: Two times a day (BID) | OROMUCOSAL | 0 refills | Status: AC

## 2016-11-24 MED ORDER — INSULIN NPH (HUMAN) (ISOPHANE) 100 UNIT/ML ~~LOC~~ SUSP: 30.0000 [IU] | Freq: Four times a day (QID) | SUBCUTANEOUS | 11 refills | Status: AC

## 2016-11-24 MED ORDER — ORAL CARE MOUTH RINSE: 15.0000 mL | Freq: Four times a day (QID) | OROMUCOSAL | 0 refills | Status: AC

## 2016-11-24 MED ORDER — ACETAMINOPHEN 160 MG/5ML PO SOLN: 650.0000 mg | Freq: Four times a day (QID) | ORAL | 0 refills | Status: AC | PRN

## 2016-11-24 MED ORDER — FENTANYL CITRATE (PF) 100 MCG/2ML IJ SOLN: 25.0000 ug | INTRAMUSCULAR | 0 refills | Status: AC | PRN

## 2016-11-24 MED ORDER — FREE WATER: 400.0000 mL | Status: AC

## 2016-11-24 MED ORDER — INSULIN ASPART 100 UNIT/ML ~~LOC~~ SOLN: 0.0000 [IU] | SUBCUTANEOUS | 11 refills | Status: AC

## 2016-11-24 MED ORDER — RANITIDINE HCL 150 MG/10ML PO SYRP: 150.0000 mg | ORAL_SOLUTION | Freq: Every day | ORAL | 0 refills | Status: AC

## 2016-11-24 MED ORDER — DEXTROSE 5 % IV SOLN: 1.0000 g | INTRAVENOUS | Status: AC

## 2016-11-24 MED ORDER — METOPROLOL TARTRATE 25 MG/10 ML ORAL SUSPENSION: 25.0000 mg | Freq: Two times a day (BID) | ORAL | Status: AC

## 2016-11-24 NOTE — Discharge Summary (Signed)
Physician Discharge Summary       Patient ID: Andrew [name] MRN: [id number] DOB/AGE: 03/23/1956 60 y.o.  Admit date: 11/08/2016 Discharge date: 11/24/2016  Discharge Diagnoses:   Acute Encephalopathy  Anoxic Brain Injury Acute Hypoxic Respiratory Failure  H/O OSA Elevated Troponin I - Likely demand ischemia.  Sinus Tachycardia H/O HTN H/O Hyperlipidemia  Acute Renal Failure  Severe Rhabdomyolysis Hypernatremia  Hyperchloremia Hypokalemia Non anion gap metabolic acidosis  Transaminitis H/O GERD Leukocytosis Anemia Thrombocytopenia Klebsiella tracheobronchitis  Aspiration Pneumonia   Detailed Hospital Course:   60 y.o. male with hx IDDM, GERD, HTN, OSA presented 11/22 after being found unresponsive at home for unknown period of time.  He lives with his wife but she has been in SNF for the last month.  Was last seen normal 2 days prior although family is trying to determine if his home health aide saw him in the interim.  Was found to have glucose 21, no improvement in mental status with D50.  He was bagged by EMS en route and had multiple episodes of vomiting with obvious aspiration. Intubated on arrival to ER and PCCM called to admit.  He was admitted to the intensive care. Supported on mechanical ventilation. Diagnostic imaging showed: EEG w/ severe diffuse dysfunction, MRI showed mild atrophy and white matter disease felt to reflect chronic microvascular ischemia (the MRI was obtained on 12/7). Neurology was consulted. They again repeated EEG on 11/28 off sedation which showed "diffuse back ground suppression w/out reactivity". Thus consistent w/ global brain injury. Care remained supportive in nature. His stay was complicated by: Klebsiella tracheobronchitis, hyperglycemia and severe hypernatremia. These were all treated supportively. He also had difficulty with tracheostomy cuff leak which we felt was a mix of 1) trach fit (which we treated by changing to XLT proximal on  12/7) and increased respiratory pattern c/w neurological injury.   In spite of aggressive medical therapy his mental status never improved. We had multiple meetings with the family discussing the devastating nature of his neurological injury but the family could not come to terms with this and continued to desire aggressive therapy. Eventually the medical team in collaboration with our colleagues at [facility] were able to develop an on-going supportive care plan where we will transfer him to [facility] followed by Dr Verneita Griffes who will continue to follow for palliative support and on-going support of the family as well.      Discharge Plan by active problems   Acute Encephalopathy - Likely due to severe hypoglycemia w/ unknown downtime. Last seen normal 2 days prior to admit. Anoxic Brain Injury  Sedation on Ventilator  Plan Desired RASS:  0 Fentanyl IV prn Pain  Tracheostomy and ventilator dependence  Trach cuff leak H/O OSA Plan:   Full Vent Support  Elevated Troponin I - Likely demand ischemia.  Sinus Tachycardia H/O HTN H/O Hyperlipidemia  Plan:  Continuous telemetry monitoring Vitals per unit protocol Lopressor 12.70m VT bid Holding home Cozaar Goal MAP >65   Acute Renal Failure - Improving. Severe Rhabdomyolysis - Improving. Hypernatremia - Stagble. Hyperchloremia - Stable. Hypokalemia - Resolved. NAGMA - Secondary to hyperchloremia.  Plan:   Monitoring UOP Trending electrolytes & renal function daily Replacing electrolytes as indicated Continuing Free Water 400cc q4hr VT & D5W  Transaminitis - Improving. Likely due to hypoglycemia. H/O GERD Protein calorie malnutrition  Plan:   Continuing Tube Feedings Zantac 1552mqhs   Leukocytosis - Improving. Unclear etiology. Anemia - Likely dilutional. No evidence of active  bleeding. Slight worsening today. Thrombocytopenia - Likely dilutional. Improving. Plan:  Trending cell counts daily w/  CBC SCDs Heparin [place] q8hr   Klebsiella tracheobronchitis  Plan:     Cont rocephin (has 7 more days)    Significant Hospital tests/ studies  Consults: neurology   EEG 11/22: severe diffuse dysfxn CT Head 11/22:  Nothing acute.  MRI Brain 11/27: Mild atrophy and white matter disease is slightly advanced for age. This likely reflects the sequela of chronic microvascular ischemia  MICROBIOLOGY: MRSA PCR 11/22:  Negative Urine Ctx 11/22:  Negative Blood Ctx x2 11/22:  Negative Blood Ctx x2 11/29:  1/2 Coag Negative Staph  Blood Ctx x2 11/30:  Negative  Blood Ctx x2 12/4 >> Tracheal Asp Ctx 12/4 >> Klebsiella pneumoniae  Urine Ctx 12/4:  Negative   ANTIBIOTICS: Zosyn 11/22 - 11/27 Vancomycin 11/30 - 12/3 Cefepime 12/5 >>12/7 Rocephin 12/7>>>  SIGNIFICANT EVENTS: 11/22 - admit after being found unresponsive. Hypoglycemia proloned down time.   LINES/TUBES: ETT 11/22 - 12/1 Trach #6 Hyman Bible) 12/1 >>> 12/7 (changed to proximal XLT) RUE TL PICC 11/23 >>> FOLEY >>> L NGT 12/2 >>>  Discharge Exam: BP 133/70   Pulse (!) 106   Temp 100.2 F (37.9 C) (Oral)   Resp (!) 28   Ht 6' (1.829 m)   Wt 219 lb 2.2 oz (99.4 kg)   SpO2 98%   BMI 29.72 kg/m   General: Remains in COMA  Eyes closed. No family present.  Neuro:  Doesn't follow commands. Doesn't attend to voice. Triple flexion bilateral lower extremities. Unchanged  HEENT:  trach in place. + air leak. No scleral icterus or injection. Opaque corneas. Cardiovascular:  Regular rhythm. Mild upper extremity edema. No appreciable JVD. Pulmonary:  Symmetric chest rise on ventilator. Coarse breath sounds bilaterally/ scattered rhonchi Abdomen:  Soft. Protuberant. Normoactive bowel sounds. Integument:  Warm & dry. No rash on exposed skin.  Labs at discharge Lab Results  Component Value Date   CREATININE 2.35 (H) 11/24/2016   BUN 68 (H) 11/24/2016   NA 149 (H) 11/24/2016   K 4.3 11/24/2016   CL 125 (H) 11/24/2016   CO2 16  (L) 11/24/2016   Lab Results  Component Value Date   WBC 12.7 (H) 11/24/2016   HGB 9.3 (L) 11/24/2016   HCT 29.4 (L) 11/24/2016   MCV 90.5 11/24/2016   PLT 182 11/24/2016   Lab Results  Component Value Date   ALT 162 (H) 11/21/2016   AST 86 (H) 11/21/2016   ALKPHOS 203 (H) 11/21/2016   BILITOT 1.0 11/21/2016   Lab Results  Component Value Date   INR 1.32 11/21/2016   INR 1.24 11/17/2016    Current radiology studies No results found.  Disposition:  Final discharge disposition not confirmed  Discharge Instructions    Discharge instructions    Complete by:  As directed    Vent Mode: CPAP;PSV FiO2 (%):  [30 %-40 %] 40 % Set Rate:  [18 bmp] 18 bmp Vt Set:  [620 mL] 620 mL PEEP:  [5 cmH20] 5 cmH20 Pressure Support:  [10 cmH20] 10 cmH20 Plateau Pressure:  [18 cmH20-25 cmH20] 23 cmH20       Medication List    STOP taking these medications   BD PEN NEEDLE NANO U/F 32G X 4 MM Misc Generic drug:  Insulin Pen Needle   esomeprazole 40 MG capsule Commonly known as:  NEXIUM   famotidine 20 MG tablet Commonly known as:  PEPCID   fexofenadine 180  MG tablet Commonly known as:  ALLEGRA   furosemide 20 MG tablet Commonly known as:  LASIX   gabapentin 100 MG capsule Commonly known as:  NEURONTIN   gabapentin 300 MG capsule Commonly known as:  NEURONTIN   Insulin Glargine 100 UNIT/ML Solostar Pen Commonly known as:  LANTUS SOLOSTAR   LEVEMIR FLEXPEN 100 UNIT/ML Pen Generic drug:  Insulin Detemir   losartan 100 MG tablet Commonly known as:  COZAAR   metoprolol succinate 25 MG 24 hr tablet Commonly known as:  TOPROL-XL   PROAIR HFA 108 (90 Base) MCG/ACT inhaler Generic drug:  albuterol Replaced by:  albuterol (2.5 MG/3ML) 0.083% nebulizer solution   SYMBICORT 160-4.5 MCG/ACT inhaler Generic drug:  budesonide-formoterol   triamcinolone cream 0.5 % Commonly known as:  KENALOG   zolpidem 10 MG tablet Commonly known as:  AMBIEN     TAKE these  medications   acetaminophen 160 MG/5ML solution Commonly known as:  TYLENOL Place 20.3 mLs (650 mg total) into feeding tube every 6 (six) hours as needed for fever (fever >101.5).   albuterol (2.5 MG/3ML) 0.083% nebulizer solution Commonly known as:  PROVENTIL Take 3 mLs (2.5 mg total) by nebulization every 2 (two) hours as needed for wheezing. Replaces:  PROAIR HFA 108 (90 Base) MCG/ACT inhaler   cefTRIAXone 1 g in dextrose 5 % 50 mL Inject 1 g into the vein daily.   chlorhexidine gluconate (MEDLINE KIT) 0.12 % solution Commonly known as:  PERIDEX 15 mLs by Mouth Rinse route 2 (two) times daily.   dextrose 5 % solution 100 ml/hr   feeding supplement (PRO-STAT SUGAR FREE 64) Liqd Take 60 mLs by mouth 4 (four) times daily.   feeding supplement (VITAL HIGH PROTEIN) Liqd liquid Place 1,000 mLs into feeding tube daily.   fentaNYL 100 MCG/2ML injection Commonly known as:  SUBLIMAZE Inject 0.5-2 mLs (25-100 mcg total) into the vein every hour as needed (Pain or Discomfort).   free water Soln Place 400 mLs into feeding tube every 4 (four) hours.   heparin 5000 UNIT/ML injection Inject 1 mL (5,000 Units total) into the skin every 8 (eight) hours.   insulin aspart 100 UNIT/ML injection Commonly known as:  novoLOG Inject 0-20 Units into the skin every 4 (four) hours.   insulin NPH Human 100 UNIT/ML injection Commonly known as:  HUMULIN N,NOVOLIN N Inject 0.3 mLs (30 Units total) into the skin every 6 (six) hours.   metoprolol 5 MG/5ML Soln injection Commonly known as:  LOPRESSOR Inject 5 mLs (5 mg total) into the vein every 4 (four) hours as needed.   metoprolol tartrate 25 mg/10 mL Susp Commonly known as:  LOPRESSOR Place 10 mLs (25 mg total) into feeding tube 2 (two) times daily.   mouth rinse Liqd solution 15 mLs by Mouth Rinse route QID.   pantoprazole 40 MG tablet Commonly known as:  PROTONIX Take 40 mg by mouth daily.   ranitidine 150 MG/10ML syrup Commonly  known as:  ZANTAC Place 10 mLs (150 mg total) into feeding tube at bedtime.        Discharged Condition: terminally ill   Physician Statement:   The Patient was personally examined, the discharge assessment and plan has been personally reviewed and I agree with ACNP Brystal Kildow's assessment and plan. > 30 minutes of time have been dedicated to discharge assessment, planning and discharge instructions.   Signed: Clementeen Graham 11/24/2016, 10:13 AM

## 2016-11-24 NOTE — Care Management Note (Addendum)
Case Management Note  Patient Details  Name: Andrew [name] MRN: [id number] Date of Birth: [birth year]  Subjective/Objective:     Pt admitted after being found unresponsive for unknown amount of time               Action/Plan:  PTA from home.  Wife recently stayed in SNF.  Per attending prognosis is very poor -  however when he spoke with family (wife and two daughters) at length they were insistent that withdraw is not an option.  Will plan on tracheostomy towards the end of the week and stay as full code.  CM will continue to follow for discharge needs  Expected Discharge Date:                  Expected Discharge Plan:  Skilled Nursing Facility  In-House Referral:  Clinical Social Work  Discharge planning Services  CM Consult  Post Acute Care Choice:    Choice offered to:     DME Arranged:    DME Agency:     HH Arranged:    HH Agency:     Status of Service:  In process, will continue to follow  If discussed at Long Length of Stay Meetings, dates discussed:    Additional Comments: 11/24/2016  1347:  Pt transported to [facility], [facility] made aware that pt is on his way.  Daughter and wife are at bedside and have no complaints nor concerns.  No other CM needs  Bedside nurse has already called report to [facility].  CM contacted by [facility] - bed is available.  CM also contacted by daughter Marcelle Smilingatasha - family still in agreement for transfer to [facility] today.  CM contacted [facility] and spoke with Maralyn SagoSarah - pt is scheduled to be picked up today around 1pm - this time is acceptable with family.  [facility] informed pt is on ventilator, tube feed and maintenance fluids.  CM provided bedside nurse report number and receiving doctor Dr Hillary BowMckay Crowley, pt will go to room 203 on LTACH side.  CM gave bedside nurse transport packet consisting of Facesheet, (pt is full code so no DNR sheet, transport form and discharge summary.  11/23/16 Daughter toured facility and now at bedside with wife on speaker;  they both agree for pt to discharge to [facility] tomorrow.  CM communicated this with bedside nurse and [facility] liason  Pts wife contacted CM; CM explained in detail that [facility] to allow family more time to finalize goals of care.  CM has also explained to wife the LTAC has been determined to be a safe/appropriate dispositon. Wife stated at this time they are unsure if they will like pt to discharge to facility - stated bad reviews as the barrier. CM explained that [facility] is the only [facility] [place] willing [place] offer [place] a bed and that bed offer is only good for 48 hours (per facility).  Daughter will tour [facility] at 2pm today and per wife - the decision will be made at that point regarding possible discharge to facility/goals of care and wife will follow up with Cm  Unfortunately [facility] will not be able to offer pt a bed.  CM attempted to contact family to inform but was unsuccessful.  Discussed in LOS 11/23/16 - pt remains appropriate for continued stay.  Physician Advisor [facility] the basis of an extention of palliative support - family still struggling with terminal extubation and continued  care.  Both agencies received and accepted referral during LOS.  Per pts chart - daughter needs to be consulted due to wife being in facility.   CM spoke with daughter Marcelle Smilingatasha and explained why pt is now eligible for LTACH benefit and CM offered pt choice between [facility] and [facility].  CM reiterated that transition [facility] does not change pts prognosis and will simply provide more time for family finalized goals of care. Daughter stated she wanted to call mom and explain and provide the choice - daughter to follow back up with CM.     11/21/16 CM received call from [facility] - pts daughter/wife contacted facility and requested agency to look over pts case to determine if pt is appropriate for LTACH.  CM informed liaison that pt per  attending from last week and this week, in addition to physician advisor has been deemed not appropriate for LTACH due to lack of improvement since admit - pt is not expected to improve and care would be custodial only.  CM followed back up with attending and was again informed that [facility] will not [facility] made.  CM and CSW will continue to follow   Discussed in LOS 11/21/16:  Pt remains appropriate for continued stay and due to prognosis [facility] not deemed [facility] - discharge plan is for out of state SNF.  CM spoke in detail 11/17/16 with family and explained that due to poor prognosis - pt will need to placed outside of state at a vent SNF.    11/17/16  CM spoke with wife, daughter and brother in detail at bedside.  Family understands that if pt doesn't wean off ventilator he will need to placed outside of the state.  CM also reviewed the requirements from family for pt to return home with high supplemental oxgyen needs - wife stated she would not be able to care for him in the home . Wife will remain in SNF until 11/29/16.  Current plan is to see how pt does over the weekend to determine appropriate discharge plan.    Discussed during LOS 11/16/16 - pt remains appropriate for continued stay and discharge plan with current trend is SNF.  Pt trached today per family request.  Per attending ; its not likely that pt will wean from ventilator due to AMS - r/t severe hypoglycemia with unknown down time,-EEG c/w severe cerebral injury. -Poor prognosis need family goals of care.  CSW consulted Cherylann ParrClaxton, Niang Mitcheltree S, RN 11/24/2016, 9:49 AM

## 2016-11-24 NOTE — Care Management Important Message (Signed)
Important Message  Patient Details  Name: Andrew [name] MRN: [id number] Date of Birth: 02/21/1956   Medicare Important Message Given:  Other (see comment)    [name] 11/24/2016, 12:11 PM

## 2016-11-24 NOTE — Progress Notes (Signed)
[facility] going [facility]. CSW signing off at this time. Please contact should new need(s) arise.    Enos FlingAshley Keysean Savino, MSW, LCSW [facility] ED/[facility]M Clinical Social Worker [phone]

## 2016-11-25 LAB — CULTURE, BLOOD (ROUTINE X 2): Culture: NO GROWTH

## 2017-01-06 NOTE — Progress Notes (Deleted)
   Subjective:    Patient ID: Andrew [name], male    DOB: August 21, 1956, 61 y.o.   MRN: [id number]  HPI Pt returns for f/u of diabetes mellitus: DM type: Insulin-requiring type 2 Dx'ed: 1991, when he presented with severe hyperglycemia (glucose was 1300).   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  Therapy: insulin since 2008. DKA: never.   Severe hypoglycemia: never.  Pancreatitis: never.   Other: he has an audio cbg meter; he was changed to a qd insulin regimen, due to poor results with multiple daily injections.   Interval history:  Pt says he never misses the insulin.  no cbg record, but states cbg's vary from 60-200's. There is no trend throughout the day, except it is lowest in the afternoon.  pt states he feels well in general.     Review of Systems     Objective:   Physical Exam VITAL SIGNS: See vs page.  GENERAL: no distress.   MSK: no deformity of the feet.   CV: 1+ bilat leg edema.   Skin: no ulcer on the feet. normal color and temp on the feet.  Neuro: sensation is intact to touch on the feet.  Ext: There is bilateral onychomycosis ofthe toenails.        Assessment & Plan:

## 2017-01-09 ENCOUNTER — Ambulatory Visit: Payer: Medicare Other | Admitting: Endocrinology

## 2017-01-09 ENCOUNTER — Telehealth: Payer: Self-pay | Admitting: Internal Medicine

## 2017-01-09 NOTE — Telephone Encounter (Deleted)
No show

## 2017-01-10 ENCOUNTER — Telehealth: Payer: Self-pay | Admitting: Endocrinology

## 2017-01-10 NOTE — Telephone Encounter (Signed)
Patient no showed today's appt. Please advise on how to follow up. °A. No follow up necessary. °B. Follow up urgent. Contact patient immediately. °C. Follow up necessary. Contact patient and schedule visit in ___ days. °D. Follow up advised. Contact patient and schedule visit in ____weeks. ° °

## 2017-01-10 NOTE — Telephone Encounter (Signed)
Follow up advised. Contact patient and schedule visit in 6 weeks. 

## 2017-01-11 NOTE — Telephone Encounter (Signed)
Called Pt, LVM

## 2017-01-18 DEATH — deceased

## 2017-01-31 ENCOUNTER — Ambulatory Visit (INDEPENDENT_AMBULATORY_CARE_PROVIDER_SITE_OTHER): Payer: Self-pay | Admitting: Podiatry

## 2017-01-31 NOTE — Progress Notes (Signed)
Erroneous encounter no show lisa cox 

## 2017-11-06 IMAGING — CT CT ABDOMEN W/O CM
2 of 4 series · 17 of 46 positions shown, 19 images · non-contrast
Comparison: None.

CLINICAL DATA: Dysphagia.

EXAM:
CT ABDOMEN WITHOUT CONTRAST
TECHNIQUE: Multidetector CT imaging of the abdomen was performed following the
standard protocol without IV contrast.

[Series 2: abd/ pelvis 5.0 i30f 1 · axial · 0.93mm/px · z∈[+836,+1126]mm · 14 of 64 slices shown, 16 images]
[im 3/64  soft-tissue]
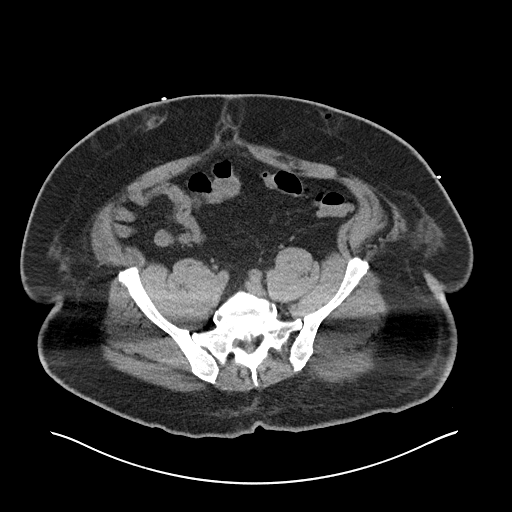
[im 3/64  bone]
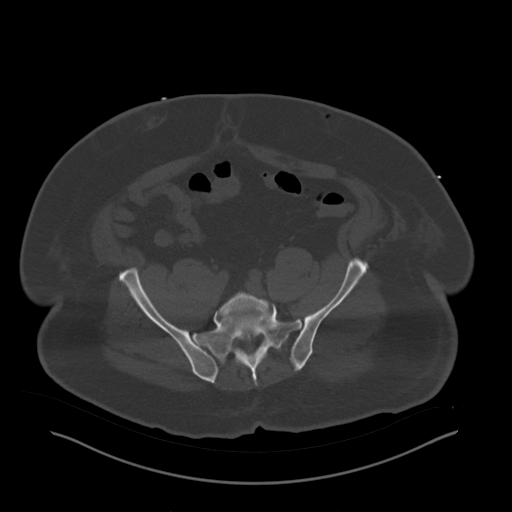
[im 8/64  soft-tissue]
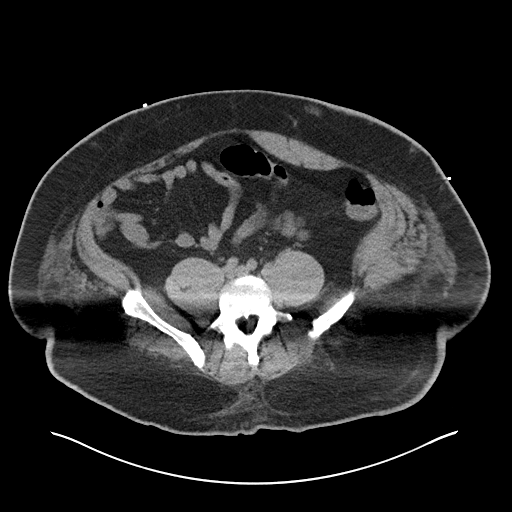
[im 13/64  soft-tissue]
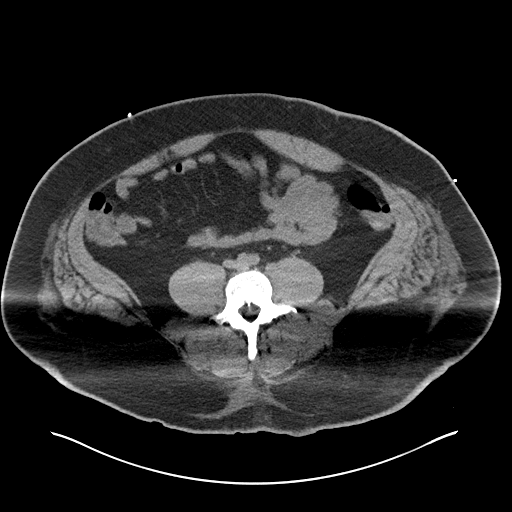
[im 18/64  soft-tissue]
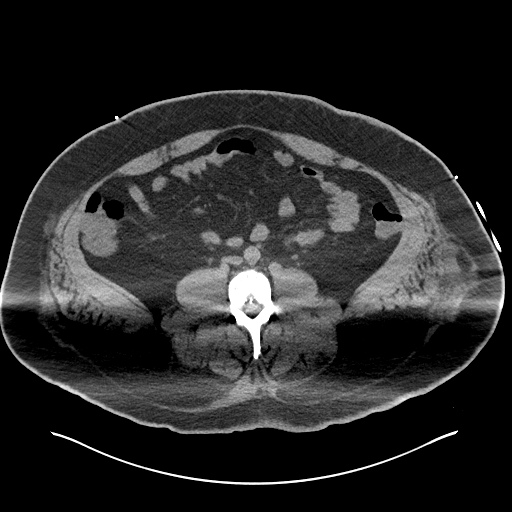
[im 21/64  soft-tissue]
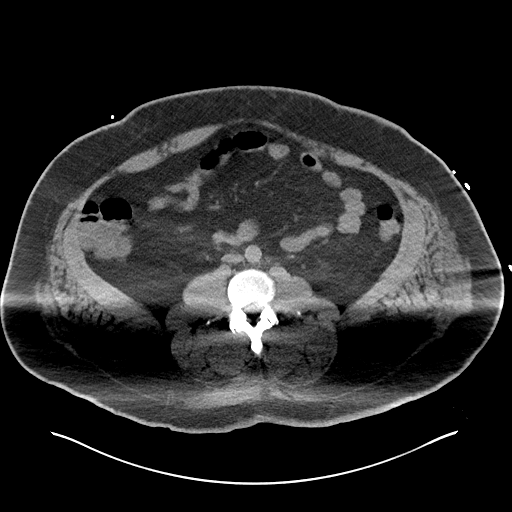
[im 26/64  soft-tissue]
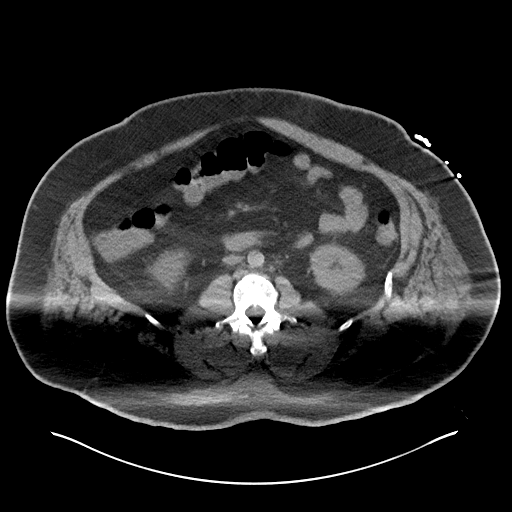
[im 31/64  soft-tissue]
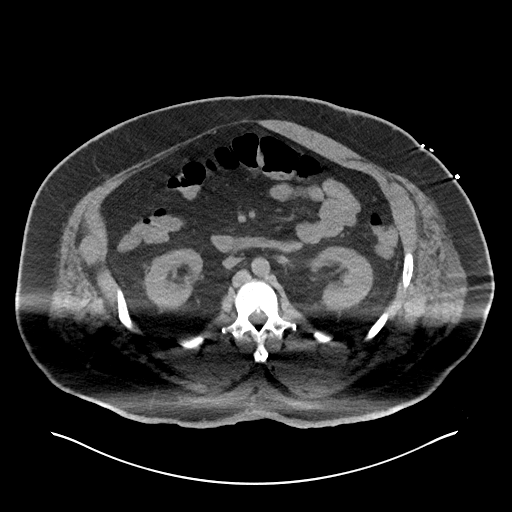
[im 33/64  soft-tissue]
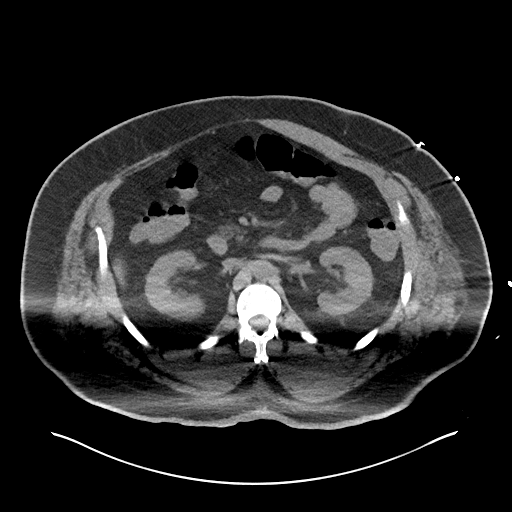
[im 38/64  soft-tissue]
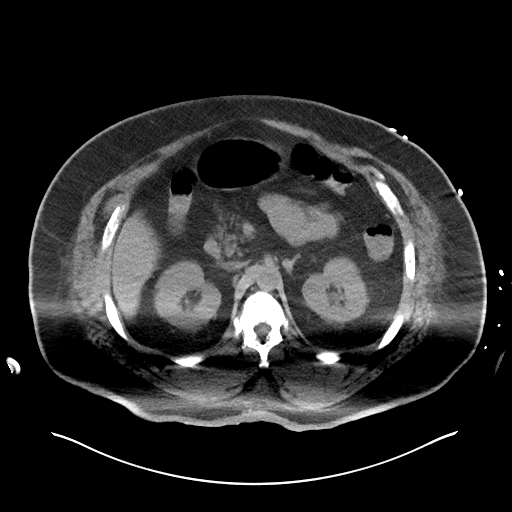
[im 38/64  bone]
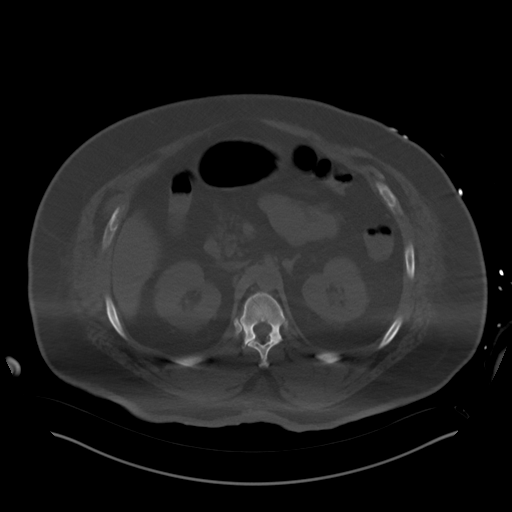
[im 43/64  soft-tissue]
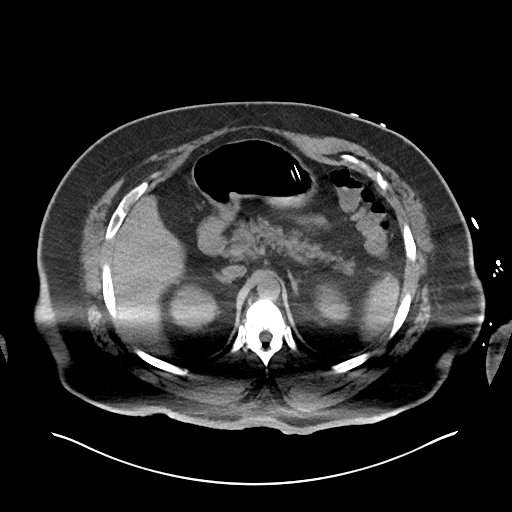
[im 48/64  soft-tissue]
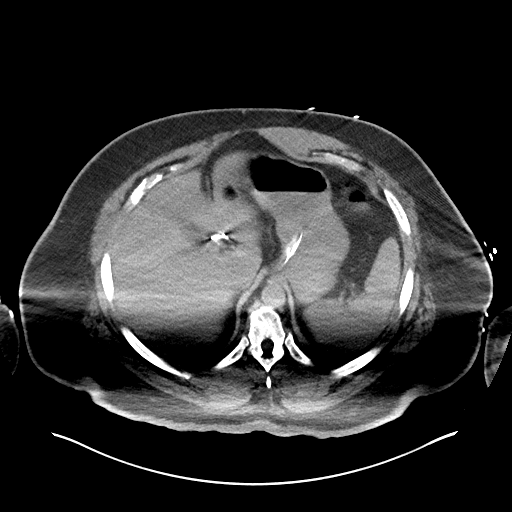
[im 51/64  soft-tissue]
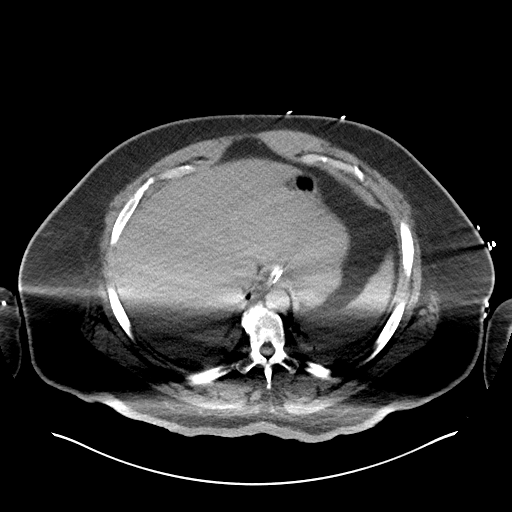
[im 56/64  soft-tissue]
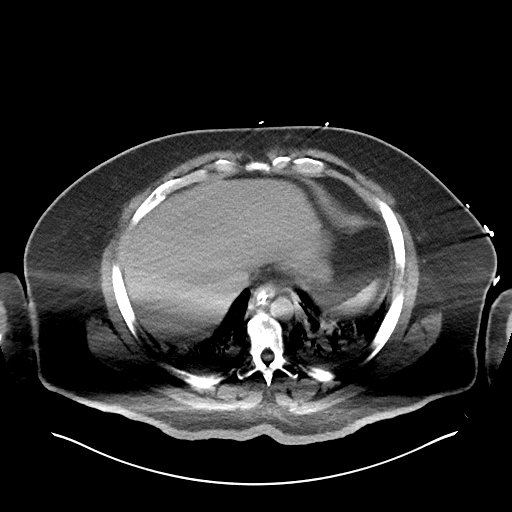
[im 61/64  soft-tissue]
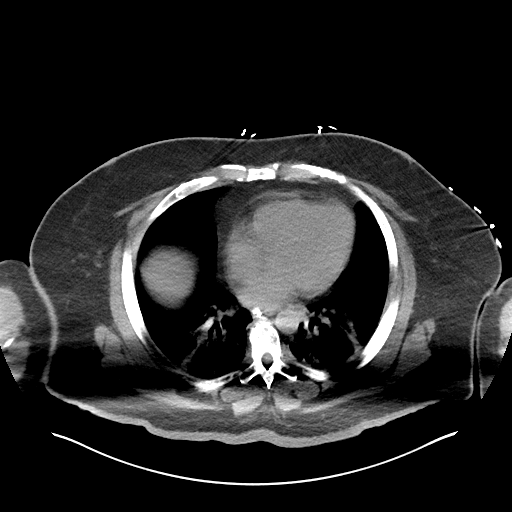

[Series 5: cor st · coronal · 0.70mm/px · 3 of 101 slices shown]
[im 34/101  soft-tissue]
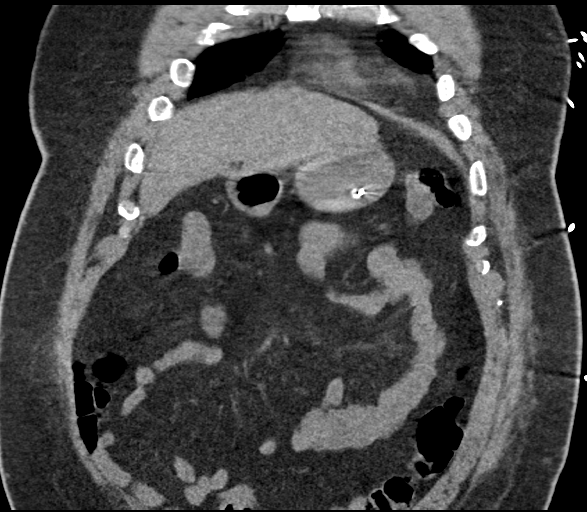
[im 45/101  soft-tissue]
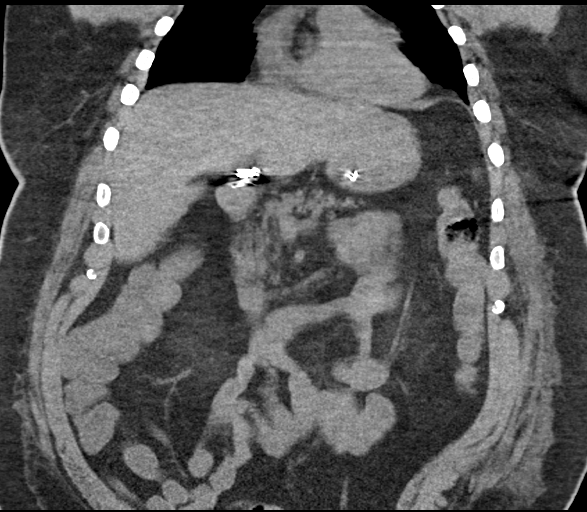
[im 56/101  soft-tissue]
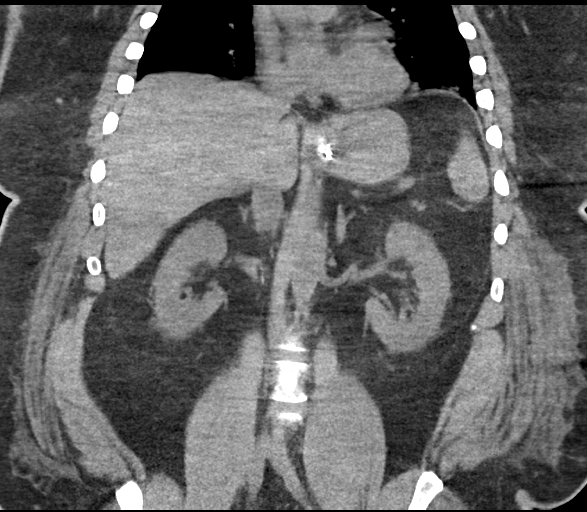

[17 of 46 positions shown; findings below may reference images not displayed]

FINDINGS: Lower chest: Mild bilateral posterior basilar atelectasis or
pneumonia is noted.

Hepatobiliary: No focal liver abnormality is seen. Status post
cholecystectomy. No biliary dilatation.

Pancreas: Unremarkable. No pancreatic ductal dilatation or
surrounding inflammatory changes.

Spleen: Normal in size without focal abnormality.

Adrenals/Urinary Tract: Adrenal glands are unremarkable. Kidneys are
normal, without renal calculi, focal lesion, or hydronephrosis.

Stomach/Bowel: Nasogastric tube tip is seen in stomach. There is no
evidence of bowel obstruction. The appendix appears normal.

Vascular/Lymphatic: No significant vascular findings are present. No
enlarged abdominal or pelvic lymph nodes.

Other: Moderate size fat containing periumbilical hernia is noted.

Musculoskeletal: No acute or significant osseous findings.
IMPRESSION: Mild bilateral posterior basilar atelectasis or pneumonia is noted.

Nasogastric tube tip is seen in stomach.

Moderate size fat containing periumbilical hernia is noted.

Status post cholecystectomy.

No acute abnormality is noted in the abdomen.
